# Patient Record
Sex: Female | Born: 1965 | Race: Black or African American | Hispanic: No | Marital: Single | Smoking: Never smoker
Health system: Southern US, Community
[De-identification: ages and names within clinical notes are randomized; demographics above are authoritative.]

## PROBLEM LIST (undated history)

## (undated) DIAGNOSIS — I319 Disease of pericardium, unspecified: Secondary | ICD-10-CM

## (undated) DIAGNOSIS — Z8489 Family history of other specified conditions: Secondary | ICD-10-CM

## (undated) DIAGNOSIS — M329 Systemic lupus erythematosus, unspecified: Secondary | ICD-10-CM

## (undated) DIAGNOSIS — IMO0002 Reserved for concepts with insufficient information to code with codable children: Secondary | ICD-10-CM

## (undated) DIAGNOSIS — I10 Essential (primary) hypertension: Secondary | ICD-10-CM

## (undated) HISTORY — PX: FOOT SURGERY: SHX648

## (undated) HISTORY — PX: ESSURE TUBAL LIGATION: SUR464

---

## 1999-03-09 ENCOUNTER — Emergency Department (HOSPITAL_COMMUNITY): Admission: EM | Admit: 1999-03-09 | Discharge: 1999-03-09 | Payer: Self-pay

## 2001-07-05 ENCOUNTER — Emergency Department (HOSPITAL_COMMUNITY): Admission: EM | Admit: 2001-07-05 | Discharge: 2001-07-05 | Payer: Self-pay | Admitting: Internal Medicine

## 2001-11-01 ENCOUNTER — Other Ambulatory Visit: Admission: RE | Admit: 2001-11-01 | Discharge: 2001-11-01 | Payer: Self-pay | Admitting: Obstetrics and Gynecology

## 2003-01-18 ENCOUNTER — Other Ambulatory Visit: Admission: RE | Admit: 2003-01-18 | Discharge: 2003-01-18 | Payer: Self-pay | Admitting: Obstetrics & Gynecology

## 2004-06-25 ENCOUNTER — Ambulatory Visit (HOSPITAL_BASED_OUTPATIENT_CLINIC_OR_DEPARTMENT_OTHER): Admission: RE | Admit: 2004-06-25 | Discharge: 2004-06-25 | Payer: Self-pay | Admitting: Obstetrics and Gynecology

## 2004-06-25 ENCOUNTER — Ambulatory Visit (HOSPITAL_COMMUNITY): Admission: RE | Admit: 2004-06-25 | Discharge: 2004-06-25 | Payer: Self-pay | Admitting: Obstetrics and Gynecology

## 2004-06-25 ENCOUNTER — Encounter (INDEPENDENT_AMBULATORY_CARE_PROVIDER_SITE_OTHER): Payer: Self-pay | Admitting: *Deleted

## 2005-06-06 ENCOUNTER — Emergency Department (HOSPITAL_COMMUNITY): Admission: EM | Admit: 2005-06-06 | Discharge: 2005-06-06 | Payer: Self-pay | Admitting: Emergency Medicine

## 2007-08-26 ENCOUNTER — Emergency Department (HOSPITAL_COMMUNITY): Admission: EM | Admit: 2007-08-26 | Discharge: 2007-08-26 | Payer: Self-pay | Admitting: Emergency Medicine

## 2009-09-11 ENCOUNTER — Emergency Department (HOSPITAL_COMMUNITY): Admission: EM | Admit: 2009-09-11 | Discharge: 2009-09-12 | Payer: Self-pay | Admitting: Emergency Medicine

## 2010-12-04 ENCOUNTER — Other Ambulatory Visit: Payer: Self-pay | Admitting: Obstetrics and Gynecology

## 2010-12-04 DIAGNOSIS — Z1239 Encounter for other screening for malignant neoplasm of breast: Secondary | ICD-10-CM

## 2010-12-23 ENCOUNTER — Ambulatory Visit
Admission: RE | Admit: 2010-12-23 | Discharge: 2010-12-23 | Disposition: A | Discharge status: Home / Self Care | Payer: 59 | Source: Ambulatory Visit | Attending: Obstetrics and Gynecology | Admitting: Obstetrics and Gynecology

## 2010-12-23 DIAGNOSIS — Z1239 Encounter for other screening for malignant neoplasm of breast: Secondary | ICD-10-CM

## 2011-01-22 ENCOUNTER — Other Ambulatory Visit: Payer: Self-pay | Admitting: Obstetrics

## 2011-02-14 ENCOUNTER — Emergency Department (HOSPITAL_COMMUNITY)
Admission: EM | Admit: 2011-02-14 | Discharge: 2011-02-14 | Disposition: A | Discharge status: Home / Self Care | Payer: 59 | Attending: Emergency Medicine | Admitting: Emergency Medicine

## 2011-02-14 DIAGNOSIS — I10 Essential (primary) hypertension: Secondary | ICD-10-CM | POA: Insufficient documentation

## 2011-02-14 DIAGNOSIS — M2550 Pain in unspecified joint: Secondary | ICD-10-CM | POA: Insufficient documentation

## 2011-02-14 LAB — SEDIMENTATION RATE: Sed Rate: 22 mm/hr (ref 0–22)

## 2011-02-14 LAB — DIFFERENTIAL
Basophils Absolute: 0 10*3/uL (ref 0.0–0.1)
Basophils Relative: 1 % (ref 0–1)
Eosinophils Absolute: 0.2 10*3/uL (ref 0.0–0.7)
Eosinophils Relative: 5 % (ref 0–5)
Lymphocytes Relative: 20 % (ref 12–46)
Lymphs Abs: 0.8 10*3/uL (ref 0.7–4.0)
Monocytes Relative: 6 % (ref 3–12)
Neutro Abs: 2.6 10*3/uL (ref 1.7–7.7)
Neutrophils Relative %: 68 % (ref 43–77)

## 2011-02-14 LAB — POCT I-STAT, CHEM 8
BUN: 9 mg/dL (ref 6–23)
Chloride: 105 mEq/L (ref 96–112)
Sodium: 142 mEq/L (ref 135–145)
TCO2: 26 mmol/L (ref 0–100)

## 2011-02-14 LAB — CBC
HCT: 32.6 % — ABNORMAL LOW (ref 36.0–46.0)
Hemoglobin: 11.5 g/dL — ABNORMAL LOW (ref 12.0–15.0)
MCH: 27.7 pg (ref 26.0–34.0)
MCHC: 35.3 g/dL (ref 30.0–36.0)

## 2011-03-27 NOTE — Op Note (Signed)
NAME:  Sandra Stevenson, Sandra Stevenson                      ACCOUNT NO.:  [id number]   MEDICAL RECORD NO.:  [id number]                   PATIENT TYPE:  AMB   LOCATION:  [facility]                                 FACILITY:  [facility]   PHYSICIAN:  Sherry A. Rosalio Macadamia, M.D.           DATE OF BIRTH:  04/27/1966   DATE OF PROCEDURE:  06/25/2004  DATE OF DISCHARGE:                                 OPERATIVE REPORT   PREOPERATIVE DIAGNOSIS:  Intrauterine pregnancy, desire for termination,  second trimester.   POSTOPERATIVE DIAGNOSIS:  Intrauterine pregnancy, desire for termination,  second trimester.   PROCEDURE:  D&E.   SURGEON:  Dr. Rosalio Macadamia   ANESTHESIA:  MAC.   INDICATIONS FOR PROCEDURE:  This is a 45 year old, G4, P1-0-2-1 woman, who  had a positive pregnancy test, initially felt she was trying to maintain the  pregnancy but requested a termination of this pregnancy.  The patient had an  ultrasound performed at 11-1/2 weeks on August 4, showing an [facility] of December 27, 2004, making this termination a second trimester termination.   DESCRIPTION OF PROCEDURE:  The patient was brought into the operating room  and given adequate IV sedation.  She was placed in the dorsal lithotomy  position.  Pelvic examination was performed.  The patient was draped in a  sterile fashion, speculum placed within the vagina.  The vagina was washed  with Hibiclens.  Paracervical block was administered with 1% Nesacaine.  Anterior lip of the cervix was grasped with a single-tooth tenaculum after  removing the laminaria that had been placed the day before.  The cervix was  well-dilated from the laminaria.  Dilatation was begun at approximately #27  [facility] dilator.  Dilatation was increased to a #37.  A 12 mm curved curette  was introduced into the endometrial cavity.  Suction was applied.  Placental  tissue, fetal tissue, and amniotic fluid were obtained.  Using grasping  forceps, other fetal tissue was removed.  Sharp curettage  was performed.  There was no further tissue present.  Repeat suctioning was performed.  There was still a small amount of continuous bleeding.  Sharp curettage and  suction was repeated and all instruments removed from the vagina.  Uterine  massage was performed.  After a short period of time of uterine massage, the  speculum was re-placed and suction reapplied. Adequate hemostasis was felt  to be present after careful monitoring.  All instruments were again removed  from the vagina.  The patient was taken out of the dorsal lithotomy  position.  She was awakened.  She was moved from the operating room table to  a stretcher in stable condition.  Complications were none.  Estimated blood  loss was 30 mL.  Sherry A. Rosalio Macadamia, M.D.    SAD/MEDQ  D:  06/25/2004  T:  06/25/2004  Job:  [id number]

## 2011-08-19 LAB — DIFFERENTIAL
Basophils Relative: 0
Eosinophils Absolute: 0.2
Eosinophils Relative: 3
Lymphs Abs: 1.1

## 2011-08-19 LAB — POCT CARDIAC MARKERS
CKMB, poc: <1 — ABNORMAL LOW
Operator id: 4001
Operator id: 4295
Troponin i, poc: <0.05

## 2011-08-19 LAB — CBC
HCT: 34.4 — ABNORMAL LOW
Hemoglobin: 11.9 — ABNORMAL LOW
RBC: 4.12
RDW: 13.5
WBC: 4.5

## 2011-08-19 LAB — D-DIMER, QUANTITATIVE: D-Dimer, Quant: 0.25

## 2011-08-19 LAB — BASIC METABOLIC PANEL
Chloride: 96
Creatinine, Ser: 0.7
GFR calc Af Amer: >60
GFR calc non Af Amer: >60
Potassium: 2.9 — ABNORMAL LOW

## 2011-11-19 ENCOUNTER — Other Ambulatory Visit: Payer: Self-pay | Admitting: Obstetrics

## 2011-11-19 DIAGNOSIS — Z1231 Encounter for screening mammogram for malignant neoplasm of breast: Secondary | ICD-10-CM

## 2011-12-25 ENCOUNTER — Ambulatory Visit: Payer: 59

## 2012-01-18 ENCOUNTER — Ambulatory Visit
Admission: RE | Admit: 2012-01-18 | Discharge: 2012-01-18 | Disposition: A | Discharge status: Home / Self Care | Payer: 59 | Source: Ambulatory Visit | Attending: Obstetrics | Admitting: Obstetrics

## 2012-01-18 DIAGNOSIS — Z1231 Encounter for screening mammogram for malignant neoplasm of breast: Secondary | ICD-10-CM

## 2012-04-15 ENCOUNTER — Ambulatory Visit: Payer: 59

## 2012-04-15 ENCOUNTER — Emergency Department (HOSPITAL_COMMUNITY)
Admission: EM | Admit: 2012-04-15 | Discharge: 2012-04-16 | Disposition: A | Discharge status: Home / Self Care | Payer: 59 | Attending: Emergency Medicine | Admitting: Emergency Medicine

## 2012-04-15 ENCOUNTER — Ambulatory Visit (INDEPENDENT_AMBULATORY_CARE_PROVIDER_SITE_OTHER): Payer: 59 | Admitting: Family Medicine

## 2012-04-15 ENCOUNTER — Encounter (HOSPITAL_COMMUNITY): Payer: Self-pay | Admitting: *Deleted

## 2012-04-15 VITALS — BP 154/97 | HR 109 | Temp 98.5°F | Resp 16 | Ht 64.5 in | Wt 153.0 lb

## 2012-04-15 DIAGNOSIS — R071 Chest pain on breathing: Secondary | ICD-10-CM | POA: Insufficient documentation

## 2012-04-15 DIAGNOSIS — R0789 Other chest pain: Secondary | ICD-10-CM

## 2012-04-15 DIAGNOSIS — M546 Pain in thoracic spine: Secondary | ICD-10-CM

## 2012-04-15 DIAGNOSIS — R0602 Shortness of breath: Secondary | ICD-10-CM | POA: Insufficient documentation

## 2012-04-15 DIAGNOSIS — I517 Cardiomegaly: Secondary | ICD-10-CM

## 2012-04-15 DIAGNOSIS — J40 Bronchitis, not specified as acute or chronic: Secondary | ICD-10-CM | POA: Insufficient documentation

## 2012-04-15 DIAGNOSIS — R000 Tachycardia, unspecified: Secondary | ICD-10-CM

## 2012-04-15 DIAGNOSIS — R091 Pleurisy: Secondary | ICD-10-CM

## 2012-04-15 DIAGNOSIS — R079 Chest pain, unspecified: Secondary | ICD-10-CM

## 2012-04-15 DIAGNOSIS — M329 Systemic lupus erythematosus, unspecified: Secondary | ICD-10-CM | POA: Insufficient documentation

## 2012-04-15 DIAGNOSIS — M549 Dorsalgia, unspecified: Secondary | ICD-10-CM

## 2012-04-15 HISTORY — DX: Systemic lupus erythematosus, unspecified: M32.9

## 2012-04-15 HISTORY — DX: Essential (primary) hypertension: I10

## 2012-04-15 HISTORY — DX: Reserved for concepts with insufficient information to code with codable children: IMO0002

## 2012-04-15 LAB — POCT CBC
Granulocyte percent: 69.3 %G (ref 37–80)
MCH, POC: 26.4 pg — AB (ref 27–31.2)
MID (cbc): 0.5 (ref 0–0.9)
POC LYMPH PERCENT: 24.6 %L (ref 10–50)
POC MID %: 6.1 %M (ref 0–12)
WBC: 8.2 10*3/uL (ref 4.6–10.2)

## 2012-04-15 LAB — BASIC METABOLIC PANEL
BUN: 14 mg/dL (ref 6–23)
Chloride: 102 mEq/L (ref 96–112)
Creatinine, Ser: 0.85 mg/dL (ref 0.50–1.10)
GFR calc Af Amer: >90 mL/min (ref >90)
Glucose, Bld: 94 mg/dL (ref 70–99)

## 2012-04-15 LAB — CBC
Hemoglobin: 11.1 g/dL — ABNORMAL LOW (ref 12.0–15.0)
MCHC: 36 g/dL (ref 30.0–36.0)
MCV: 76.4 fL — ABNORMAL LOW (ref 78.0–100.0)
Platelets: 178 10*3/uL (ref 150–400)
RBC: 4.03 MIL/uL (ref 3.87–5.11)
RDW: 13.7 % (ref 11.5–15.5)

## 2012-04-15 LAB — DIFFERENTIAL
Eosinophils Absolute: 0.1 10*3/uL (ref 0.0–0.7)
Eosinophils Relative: 1 % (ref 0–5)
Lymphocytes Relative: 18 % (ref 12–46)
Monocytes Absolute: 0.6 10*3/uL (ref 0.1–1.0)
Neutro Abs: 5.2 10*3/uL (ref 1.7–7.7)

## 2012-04-15 LAB — POCT I-STAT, CHEM 8
Calcium, Ion: 1.15 mmol/L (ref 1.12–1.32)
Chloride: 97 mEq/L (ref 96–112)
Glucose, Bld: 93 mg/dL (ref 70–99)
HCT: 40 % (ref 36.0–46.0)
Hemoglobin: 13.6 g/dL (ref 12.0–15.0)

## 2012-04-15 MED ORDER — AZITHROMYCIN 250 MG PO TABS
500.0000 mg | ORAL_TABLET | Freq: Once | ORAL | Status: AC
  Administered 2012-04-16: 500 mg via ORAL
  Filled 2012-04-15: qty 2

## 2012-04-15 MED ORDER — TRAMADOL HCL 50 MG PO TABS: 50.0000 mg | ORAL_TABLET | Freq: Four times a day (QID) | ORAL | Status: AC | PRN

## 2012-04-15 MED ORDER — KETOROLAC TROMETHAMINE 30 MG/ML IJ SOLN
30.0000 mg | Freq: Once | INTRAMUSCULAR | Status: AC
  Administered 2012-04-15: 30 mg via INTRAVENOUS
  Filled 2012-04-15: qty 1

## 2012-04-15 MED ORDER — AZITHROMYCIN 250 MG PO TABS: 250.0000 mg | ORAL_TABLET | Freq: Every day | ORAL | Status: AC

## 2012-04-15 MED ORDER — SODIUM CHLORIDE 0.9 % IV BOLUS (SEPSIS)
1000.0000 mL | Freq: Once | INTRAVENOUS | Status: AC
  Administered 2012-04-15: 1000 mL via INTRAVENOUS

## 2012-04-15 MED ORDER — MORPHINE SULFATE 4 MG/ML IJ SOLN
4.0000 mg | Freq: Once | INTRAMUSCULAR | Status: AC
  Administered 2012-04-15: 4 mg via INTRAVENOUS
  Filled 2012-04-15: qty 1

## 2012-04-15 NOTE — Progress Notes (Signed)
Subjective: 46 year old lady with history of lupus. Patient been having upper back pain this week for the last few days. Since then swollen the upper back just below the shoulder level, warm and tender. She's used warm compresses on it. He has continued to hurt. Has been a constant soreness. Today however it seemed to move around in her chest. When she got up she was very painful across the upper chest. She hit hurts to take a deep breath. Does not have any cough. Has not any fever. Does not have any URI symptoms. She has not had pain like this before. It is a steady  hurting.  Objective  patient is in some obvious discomfort which takes a deep breath. She catches herself across her upper chest. Her throat is clear. Neck supple without significant nodes. Chest is clear to auscultation. Heart regular without any murmurs. Seems a little chest wall tenderness in the right upper chest. Her abdomen is soft without masses tenderness. However when I laid her down to examine her abdomen she got very more intense pain in the upper chest.  EKG shows a mild tachycardia heart rate 110. Sinus rhythm. Mild nonspecific ST flattening and lateral inversion which appears fairly nonspecific. Tachycardias only significant abnormality.  [facility] reading (PRIMARY) by  Dr. Alwyn Ren Cardiomegaly.  Possible posterior infiltrate behind heart  Results for orders placed in visit on 04/15/12  POCT CBC      Component Value Range   WBC 8.2  4.6 - 10.2 (K/uL)   Lymph, poc 2.0  0.6 - 3.4    POC LYMPH PERCENT 24.6  10 - 50 (%L)   MID (cbc) 0.5  0 - 0.9    POC MID % 6.1  0 - 12 (%M)   POC Granulocyte 5.7  2 - 6.9    Granulocyte percent 69.3  37 - 80 (%G)   RBC 4.24  4.04 - 5.48 (M/uL)   Hemoglobin 11.2 (*) 12.2 - 16.2 (g/dL)   HCT, POC 78.2 (*) 95.6 - 47.9 (%)   MCV 80.7  80 - 97 (fL)   MCH, POC 26.4 (*) 27 - 31.2 (pg)   MCHC 32.7  31.8 - 35.4 (g/dL)   RDW, POC 21.3     Platelet Count, POC 213  142 - 424 (K/uL)   MPV 9.7  0 -  99.8 (fL)   Assessment Pleuritic anterior chest pain Cardiomegaly Mild congestive change on chest x-ray Lupus Anemia  Plan: Her pain is significant enough along with the cardiac changes on x-ray and the blunting at the bases of the lung that I feel like she needs to be seen in to the emergency room for assessment to try and decide what is going on with her. This is an atypical chest pain, but differential would include lupus related paracardial problems, pulmonary emboli, other cardiomyopathy, viral pleurisy, etc.    Begin IV and call EMS and given oxygen

## 2012-04-15 NOTE — Progress Notes (Signed)
Pt with chest tightness and back pain.  She has a history of lupus.  She had a similar episode in February 2013 diagnosed as pleurisy and treated with a Z-pak successfully.  Chest x-ray shows atelectasis at bases, otherwise tests negative.  Will Rx with a Z-pak, and tramadol for pain.

## 2012-04-15 NOTE — Patient Instructions (Signed)
We are happy to be of assistance at a time that we can.

## 2012-04-15 NOTE — ED Provider Notes (Signed)
History     CSN: [id number]  Arrival date & time 04/15/12  2008   First MD Initiated Contact with Patient 04/15/12 2016      Chief Complaint  Patient presents with  . Chest Pain    (Consider location/radiation/quality/duration/timing/severity/associated sxs/prior treatment) HPI  46 year old female with history of hypertension and history of lupus presents complaining of chest pain. Patient states for the past week she has had pain to the upper back spanning across her shoulders. Described pain as a burning sensation, achy, and persistent. Today, she noticed pleuritic chest pain worsening with taking deep breath, or when laying flat. Pain improves with leaning forward. There is associated shortness of breath due to increased pain with deep breathing. Onset is gradual, and persistent. She denies fever, chills, hemoptysis, productive cough, nausea, vomiting, diarrhea, abdominal pain, or rash.  She denies exogenous hormone, recent surgery, travel, prolonged bed rest, leg swelling or calf tenderness. Patient states since she is allergic to Plaquenil, she has been taking 10 mg of prednisone daily for the past month. She is a nonsmoker. She does have family history of heart disease.   History reviewed. No pertinent past medical history.  History reviewed. No pertinent past surgical history.  No family history on file.  History  Substance Use Topics  . Smoking status: Never Smoker   . Smokeless tobacco: Not on file  . Alcohol Use: Not on file    OB History    Grav Para Term Preterm Abortions TAB SAB Ect Mult Living                  Review of Systems  All other systems reviewed and are negative.    Allergies  Plaquenil  Home Medications   Current Outpatient Rx  Name Route Sig Dispense Refill  . PREDNISONE 10 MG PO TABS Oral Take 10 mg by mouth daily.      BP 170/108  Pulse 119  Temp(Src) 99.8 F (37.7 C) (Oral)  Resp 24  SpO2 99%  Physical Exam  Nursing note and  vitals reviewed. Constitutional: She is oriented to person, place, and time. She appears well-developed and well-nourished. No distress.       Awake, alert, nontoxic appearance  HENT:  Head: Atraumatic.  Eyes: Conjunctivae are normal. Right eye exhibits no discharge. Left eye exhibits no discharge.  Neck: Neck supple.  Cardiovascular: Normal rate and regular rhythm.   Pulmonary/Chest: Effort normal. No respiratory distress. She exhibits no tenderness.  Abdominal: Soft. There is no tenderness. There is no rebound.  Musculoskeletal: She exhibits no edema and no tenderness.       ROM appears intact, no obvious focal weakness.  Pt point to upper back as source of pain, however nonreproducible on exam.  Pt also has midchest pain, non reproducible.  NO overlying skin changes  Neurological: She is alert and oriented to person, place, and time.       Mental status and motor strength appears intact  Skin: No rash noted.  Psychiatric: She has a normal mood and affect.    ED Course  Procedures (including critical care time)  Labs Reviewed - No data to display Dg Chest 2 View  04/15/2012  *RADIOLOGY REPORT*  Clinical Data: Chest pain with breathing  CHEST - 2 VIEW  Comparison: Chest radiograph 08/26/1999  Findings: Enlarged cardiopericardial silhouette, similar to prior exam.  Pulmonary vascularity is within normal limits.  The lung volumes are slightly low.  There are faint bibasilar opacities.  No definite  pleural effusion.  Negative for pneumothorax.  Trachea midline.  No acute bony abnormality.  IMPRESSION: Low lung volumes with patchy bibasilar opacities.  These could reflect atelectasis and/or infiltrate.  Cardiomegaly without edema.  Clinically significant discrepancy from primary report, if provided: None  Original Report Authenticated By: Britta Mccreedy, M.D.     No diagnosis found.   Date: 04/15/2012  Rate: 111  Rhythm: sinus tachycardia  QRS Axis: normal  Intervals: normal  ST/T Wave  abnormalities: nonspecific T wave changes  Conduction Disutrbances:slight mid- and left-precordial repolarization disturbance, consider ischemia, LV overload or aspecific change  Narrative Interpretation:   Old EKG Reviewed: none available  Results for orders placed during the hospital encounter of 04/15/12  CBC      Component Value Range   WBC 7.2  4.0 - 10.5 (K/uL)   RBC 4.03  3.87 - 5.11 (MIL/uL)   Hemoglobin 11.1 (*) 12.0 - 15.0 (g/dL)   HCT 47.8 (*) 29.5 - 46.0 (%)   MCV 76.4 (*) 78.0 - 100.0 (fL)   MCH 27.5  26.0 - 34.0 (pg)   MCHC 36.0  30.0 - 36.0 (g/dL)   RDW 62.1  30.8 - 65.7 (%)   Platelets 178  150 - 400 (K/uL)  DIFFERENTIAL      Component Value Range   Neutrophils Relative PENDING  43 - 77 (%)   Neutro Abs PENDING  1.7 - 7.7 (K/uL)   Band Neutrophils PENDING  0 - 10 (%)   Lymphocytes Relative PENDING  12 - 46 (%)   Lymphs Abs PENDING  0.7 - 4.0 (K/uL)   Monocytes Relative PENDING  3 - 12 (%)   Monocytes Absolute PENDING  0.1 - 1.0 (K/uL)   Eosinophils Relative PENDING  0 - 5 (%)   Eosinophils Absolute PENDING  0.0 - 0.7 (K/uL)   Basophils Relative PENDING  0 - 1 (%)   Basophils Absolute PENDING  0.0 - 0.1 (K/uL)   WBC Morphology PENDING     RBC Morphology PENDING     Smear Review PENDING     nRBC PENDING  0 (/100 WBC)   Metamyelocytes Relative PENDING     Myelocytes PENDING     Promyelocytes Absolute PENDING     Blasts PENDING    TROPONIN I      Component Value Range   Troponin I <0.30  <0.30 (ng/mL)  POCT I-STAT, CHEM 8      Component Value Range   Sodium 165 (*) 135 - 145 (mEq/L)   Potassium 3.6  3.5 - 5.1 (mEq/L)   Chloride 97  96 - 112 (mEq/L)   BUN 16  6 - 23 (mg/dL)   Creatinine, Ser 8.46  0.50 - 1.10 (mg/dL)   Glucose, Bld 93  70 - 99 (mg/dL)   Calcium, Ion 9.62  9.52 - 1.32 (mmol/L)   TCO2 27  0 - 100 (mmol/L)   Hemoglobin 13.6  12.0 - 15.0 (g/dL)   HCT 84.1  32.4 - 40.1 (%)   Comment NOTIFIED PHYSICIAN    BASIC METABOLIC PANEL       Component Value Range   Sodium 138  135 - 145 (mEq/L)   Potassium 3.3 (*) 3.5 - 5.1 (mEq/L)   Chloride 102  96 - 112 (mEq/L)   CO2 24  19 - 32 (mEq/L)   Glucose, Bld 94  70 - 99 (mg/dL)   BUN 14  6 - 23 (mg/dL)   Creatinine, Ser 0.27  0.50 - 1.10 (mg/dL)  Calcium 8.4  8.4 - 10.5 (mg/dL)   GFR calc non Af Amer 83 (*) >90 (mL/min)   GFR calc Af Amer >90  >90 (mL/min)   Dg Chest 2 View  04/15/2012  *RADIOLOGY REPORT*  Clinical Data: Chest pain with breathing  CHEST - 2 VIEW  Comparison: Chest radiograph 08/26/1999  Findings: Enlarged cardiopericardial silhouette, similar to prior exam.  Pulmonary vascularity is within normal limits.  The lung volumes are slightly low.  There are faint bibasilar opacities.  No definite pleural effusion.  Negative for pneumothorax.  Trachea midline.  No acute bony abnormality.  IMPRESSION: Low lung volumes with patchy bibasilar opacities.  These could reflect atelectasis and/or infiltrate.  Cardiomegaly without edema.  Clinically significant discrepancy from primary report, if provided: None  Original Report Authenticated By: Britta Mccreedy, M.D.      MDM  Pt w/ hx of lupus present with pleuritic cp suggestive of pericarditis. CXR show cardiomegaly without edema.  Thre are patchy bibasilar opacities which could reflect atelectasis and/or infiltrate.  CXR were reviewed by me.  Pt denies cough or fever.  ECG unremarkable.  Will obtain CBC, Istat, Troponin I.  Will give toradol.  Pt currently on prednisone.     10:10 PM Toradol given in ED provide some relief.  Pt has more relief when leaning forward.  Na+ is 165, will repeat BMP to verify.    11:06 PM Repeat Na+ is within normal range.  Her electrolytes are otherwise unremarkable.  Negative troponin.  Pt felt better after treatment with toradol and morphine.    11:32 PM Since pt has a similar episode earlier this year that seems to improves with zpak, will prescribe zpak and tramadol as treatment for bronchitis,  per my attending's recommendation.  Pt voice understanding and agrees with plan.  Pt will f/u with PCP for further care.    Fayrene Helper, PA-C 04/15/12 2334

## 2012-04-15 NOTE — ED Notes (Signed)
Pt is being sent from Urgent Care facility. Sharp chest pain that started today. CP is positional, nontender to palpation. Pt has hx of lupus-concerns for pericarditis. . No acute changes on EKG.

## 2012-04-15 NOTE — Discharge Instructions (Signed)
Please take Zpak and tramadol for treatment of bronchitis.  You may also take OTC ibuprofen for pain.  If you experience fever, worsening pain, shortness of breath then return for further evaluation.  Follow up with your doctor at [facility] for further management of your lupus  Bronchitis Bronchitis is the body's way of reacting to injury and/or infection (inflammation) of the bronchi. Bronchi are the air tubes that extend from the windpipe into the lungs. If the inflammation becomes severe, it may cause shortness of breath. CAUSES  Inflammation may be caused by:  A virus.   Germs (bacteria).   Dust.   Allergens.   Pollutants and many other irritants.  The cells lining the bronchial tree are covered with tiny hairs (cilia). These constantly beat upward, away from the lungs, toward the mouth. This keeps the lungs free of pollutants. When these cells become too irritated and are unable to do their job, mucus begins to develop. This causes the characteristic cough of bronchitis. The cough clears the lungs when the cilia are unable to do their job. Without either of these protective mechanisms, the mucus would settle in the lungs. Then you would develop pneumonia. Smoking is a common cause of bronchitis and can contribute to pneumonia. Stopping this habit is the single most important thing you can do to help yourself. TREATMENT   Your caregiver may prescribe an antibiotic if the cough is caused by bacteria. Also, medicines that open up your airways make it easier to breathe. Your caregiver may also recommend or prescribe an expectorant. It will loosen the mucus to be coughed up. Only take over-the-counter or prescription medicines for pain, discomfort, or fever as directed by your caregiver.   Removing whatever causes the problem (smoking, for example) is critical to preventing the problem from getting worse.   Cough suppressants may be prescribed for relief of cough symptoms.   Inhaled medicines may  be prescribed to help with symptoms now and to help prevent problems from returning.   For those with recurrent (chronic) bronchitis, there may be a need for steroid medicines.  SEEK IMMEDIATE MEDICAL CARE IF:   During treatment, you develop more pus-like mucus (purulent sputum).   You have a fever.   Your baby is older than 3 months with a rectal temperature of 102 F (38.9 C) or higher.   Your baby is 49 months old or younger with a rectal temperature of 100.4 F (38 C) or higher.   You become progressively more ill.   You have increased difficulty breathing, wheezing, or shortness of breath.  It is necessary to seek immediate medical care if you are elderly or sick from any other disease. MAKE SURE YOU:   Understand these instructions.   Will watch your condition.   Will get help right away if you are not doing well or get worse.  Document Released: 10/26/2005 Document Revised: 10/15/2011 Document Reviewed: 09/04/2008 [facility] Patient Information 2012 [place], [place].

## 2012-04-15 NOTE — ED Provider Notes (Signed)
8:32 PM  Date: 04/15/2012  Rate: 119  Rhythm: sinus tachycardia  QRS Axis: normal  Intervals: normal PQRS:  Left atrial abnormality  ST/T Wave abnormalities: nonspecific T wave changes  Conduction Disutrbances:none  Narrative Interpretation: Borderline EKG  Old EKG Reviewed: unchanged    Carleene Cooper III, MD 04/15/12 2033

## 2012-04-16 ENCOUNTER — Telehealth: Payer: Self-pay

## 2012-04-16 LAB — POCT SEDIMENTATION RATE: POCT SED RATE: 43 mm/hr — AB (ref 0–22)

## 2012-04-16 NOTE — ED Provider Notes (Signed)
Medical screening examination/treatment/procedure(s) were conducted as a shared visit with non-physician practitioner(s) and myself.  I personally evaluated the patient during the encounter 46 yo woman with lupus has chest pain, originating in upper back, and having a pleuritic character.  Prior similar episode in February 2013 was diagnosed as pleurisy and was treated with a Z-pak with resolution of Sx.  Chest x-ray today shows bibasilar atelectasis.  Advised Rx with Z-pak.  Carleene Cooper III, MD 04/16/12 [phone]

## 2012-04-16 NOTE — ED Notes (Signed)
Pt up for discharge. Requesting something to eat before taking ordered med. Also pt gives hx of increased BP and states she does not take meds for BP as prescribed.

## 2012-04-16 NOTE — ED Notes (Signed)
Pt given happy meal and drink 

## 2012-11-09 DIAGNOSIS — I319 Disease of pericardium, unspecified: Secondary | ICD-10-CM

## 2012-11-09 HISTORY — DX: Disease of pericardium, unspecified: I31.9

## 2012-12-16 ENCOUNTER — Other Ambulatory Visit: Payer: Self-pay | Admitting: Obstetrics

## 2012-12-16 DIAGNOSIS — Z1231 Encounter for screening mammogram for malignant neoplasm of breast: Secondary | ICD-10-CM

## 2013-01-18 ENCOUNTER — Ambulatory Visit
Admission: RE | Admit: 2013-01-18 | Discharge: 2013-01-18 | Disposition: A | Discharge status: Home / Self Care | Payer: 59 | Source: Ambulatory Visit | Attending: Obstetrics | Admitting: Obstetrics

## 2013-01-18 DIAGNOSIS — Z1231 Encounter for screening mammogram for malignant neoplasm of breast: Secondary | ICD-10-CM

## 2013-12-22 ENCOUNTER — Other Ambulatory Visit: Payer: Self-pay

## 2013-12-22 DIAGNOSIS — Z1231 Encounter for screening mammogram for malignant neoplasm of breast: Secondary | ICD-10-CM

## 2013-12-27 ENCOUNTER — Ambulatory Visit: Payer: 59 | Admitting: Gynecology

## 2013-12-27 ENCOUNTER — Encounter: Payer: Self-pay | Admitting: Gynecology

## 2013-12-27 ENCOUNTER — Ambulatory Visit (INDEPENDENT_AMBULATORY_CARE_PROVIDER_SITE_OTHER): Payer: 59 | Admitting: Gynecology

## 2013-12-27 ENCOUNTER — Other Ambulatory Visit (HOSPITAL_COMMUNITY)
Admission: RE | Admit: 2013-12-27 | Discharge: 2013-12-27 | Disposition: A | Discharge status: Home / Self Care | Payer: 59 | Source: Ambulatory Visit | Attending: Gynecology | Admitting: Gynecology

## 2013-12-27 VITALS — BP 134/96 | Ht 64.0 in | Wt 155.0 lb

## 2013-12-27 DIAGNOSIS — Z01419 Encounter for gynecological examination (general) (routine) without abnormal findings: Secondary | ICD-10-CM | POA: Insufficient documentation

## 2013-12-27 DIAGNOSIS — N926 Irregular menstruation, unspecified: Secondary | ICD-10-CM

## 2013-12-27 DIAGNOSIS — Z1151 Encounter for screening for human papillomavirus (HPV): Secondary | ICD-10-CM | POA: Insufficient documentation

## 2013-12-27 DIAGNOSIS — Z113 Encounter for screening for infections with a predominantly sexual mode of transmission: Secondary | ICD-10-CM

## 2013-12-27 LAB — HIV ANTIBODY (ROUTINE TESTING W REFLEX): HIV: NONREACTIVE

## 2013-12-27 NOTE — Progress Notes (Addendum)
LOVE CHOWNING Nov 14, 1965 [id number]   History:    48 y.o.  for annual gyn exam who is a new patient to the practice. Patient has been followed by rheumatologist  as a result of her lupus. Her PCP is Dr. Criss Rosales both of which been drawing  her blood work. Her main complaint today is that she has been having irregular cycles for several years whereby since she had her Essure placed 2 years ago she has been complaining of 2 cycles per month averaging approximately 5 days in duration. Patient denied any nipple discharge, headaches, or visual disturbances. Patient denied any prior history of abnormal Pap smears or any STDs. Patient requesting HIV screen today.  Past medical history,surgical history, family history and social history were all reviewed and documented in the EPIC chart.  Gynecologic History Patient's last menstrual period was 12/23/2013. Contraception: Essure Last Pap: Several years ago. Results were: Reported to be normal Last mammogram: 2014. Results were: Dense but normal  Obstetric History OB History  Gravida Para Term Preterm AB SAB TAB Ectopic Multiple Living  3 1   1 1    1     # Outcome Date GA Lbr Len/2nd Weight Sex Delivery Anes PTL Lv  3 SAB           2 PAR           1 GRA                ROS: A ROS was performed and pertinent positives and negatives are included in the history.  GENERAL: No fevers or chills. HEENT: No change in vision, no earache, sore throat or sinus congestion. NECK: No pain or stiffness. CARDIOVASCULAR: No chest pain or pressure. No palpitations. PULMONARY: No shortness of breath, cough or wheeze. GASTROINTESTINAL: No abdominal pain, nausea, vomiting or diarrhea, melena or bright red blood per rectum. GENITOURINARY: No urinary frequency, urgency, hesitancy or dysuria. MUSCULOSKELETAL: No joint or muscle pain, no back pain, no recent trauma. DERMATOLOGIC: No rash, no itching, no lesions. ENDOCRINE: No polyuria, polydipsia, no heat or cold  intolerance. No recent change in weight. HEMATOLOGICAL: No anemia or easy bruising or bleeding. NEUROLOGIC: No headache, seizures, numbness, tingling or weakness. PSYCHIATRIC: No depression, no loss of interest in normal activity or change in sleep pattern.     Exam: chaperone present  BP 134/96  Ht 5\' 4"  (1.626 m)  Wt 155 lb (70.308 kg)  BMI 26.59 kg/m2  LMP 12/23/2013  Body mass index is 26.59 kg/(m^2).  General appearance : Well developed well nourished female. No acute distress HEENT: Neck supple, trachea midline, no carotid bruits, no thyroidmegaly Lungs: Clear to auscultation, no rhonchi or wheezes, or rib retractions  Heart: Regular rate and rhythm, no murmurs or gallops Breast:Examined in sitting and supine position were symmetrical in appearance, no palpable masses or tenderness,  no skin retraction, no nipple inversion, no nipple discharge, no skin discoloration, no axillary or supraclavicular lymphadenopathy Abdomen: no palpable masses or tenderness, no rebound or guarding Extremities: no edema or skin discoloration or tenderness  Pelvic:  Bartholin, Urethra, Skene Glands: Within normal limits             Vagina: No gross lesions or discharge  Cervix: No gross lesions or discharge  Uterus  anteverted, normal size, shape and consistency, non-tender and mobile  Adnexa  Without masses or tenderness  Anus and perineum  normal   Rectovaginal  normal sphincter tone without palpated masses or tenderness  Hemoccult not indicated     Assessment/Plan:  48 y.o. female for annual exam with dysfunctional uterine bleeding will be scheduled for sonohysterogram next week. Patient was counseled today for an endometrial biopsy to rule out any evidence of hyperplasia or endometrial carcinoma. The cervix had been cleansed with Betadine solution and a sterile Pipelle was introduced into the uterine cavity which sounded to 7-1/2 cm. Moderate amount of tissue was obtained was  submitted for histological evaluation. Prior to this a Pap smear with HPV screen was obtained. We will also be checking her TSH and prolactin today, as per patient's request and HIV will be ordered as well. I provided her with literature information on her option endometrial ablation. She stated that she did not have much success and had side effects with a Mirena IUD in the past. She did have an Essure placed 2 years ago. We'll discuss further when she comes to the office next week for the sonohysterogram.  Note: This dictation was prepared with  Dragon/digital dictation along withSmart phrase technology. Any transcriptional errors that result from this process are unintentional.   Terrance Mass MD, 4:59 PM 12/27/2013

## 2013-12-27 NOTE — Patient Instructions (Signed)
Endometrial Biopsy Endometrial biopsy is a procedure in which a tissue sample is taken from inside the uterus. The tissue sample is then looked at under a microscope to see if the tissue is normal or abnormal. The endometrium is the lining of the uterus. This procedure helps determine where you are in your menstrual cycle and how hormone levels are affecting the lining of the uterus. This procedure may also be used to evaluate uterine bleeding or to diagnose endometrial cancer, tuberculosis, polyps, or inflammatory conditions.  LET YOUR HEALTH CARE PROVIDER KNOW ABOUT:  Any allergies you have.  All medicines you are taking, including vitamins, herbs, eye drops, creams, and over-the-counter medicines.  Previous problems you or members of your family have had with the use of anesthetics.  Any blood disorders you have.  Previous surgeries you have had.  Medical conditions you have.  Possibility of pregnancy. RISKS AND COMPLICATIONS Generally, this is a safe procedure. However, as with any procedure, complications can occur. Possible complications include:  Bleeding.  Pelvic infection.  Puncture of the uterine wall with the biopsy device (rare). BEFORE THE PROCEDURE   Keep a record of your menstrual cycles as directed by your health care provider. You may need to schedule your procedure for a specific time in your cycle.  You may want to bring a sanitary pad to wear home after the procedure.  Arrange for someone to drive you home after the procedure if you will be given a medicine to help you relax (sedative). PROCEDURE   You may be given a sedative to relax you.  You will lie on an exam table with your feet and legs supported as in a pelvic exam.  Your health care provider will insert an instrument (speculum) into your vagina to see your cervix.  Your cervix will be cleansed with an antiseptic solution. A medicine (local anesthetic) will be used to numb the cervix.  A forceps  instrument (tenaculum) will be used to hold your cervix steady for the biopsy.  A thin, rodlike instrument (uterine sound) will be inserted through your cervix to determine the length of your uterus and the location where the biopsy sample will be removed.  A thin, flexible tube (catheter) will be inserted through your cervix and into the uterus. The catheter is used to collect the biopsy sample from your endometrial tissue.  The catheter and speculum will then be removed, and the tissue sample will be sent to a lab for examination. AFTER THE PROCEDURE  You will rest in a recovery area until you are ready to go home.  You may have mild cramping and a small amount of vaginal bleeding for a few days after the procedure. This is normal.  Make sure you find out how to get your test results. Document Released: 02/26/2005 Document Revised: 06/28/2013 Document Reviewed: 04/12/2013 ExitCare Patient Information 2014 [employer].  

## 2013-12-28 LAB — PROLACTIN: PROLACTIN: 3.7 ng/mL

## 2013-12-28 LAB — TSH: TSH: 0.997 u[IU]/mL (ref 0.350–4.500)

## 2014-01-01 ENCOUNTER — Other Ambulatory Visit: Payer: Self-pay | Admitting: Gynecology

## 2014-01-01 DIAGNOSIS — N926 Irregular menstruation, unspecified: Secondary | ICD-10-CM

## 2014-01-03 ENCOUNTER — Encounter: Payer: Self-pay | Admitting: Gynecology

## 2014-01-03 ENCOUNTER — Other Ambulatory Visit: Payer: Self-pay | Admitting: Gynecology

## 2014-01-03 ENCOUNTER — Ambulatory Visit (INDEPENDENT_AMBULATORY_CARE_PROVIDER_SITE_OTHER): Payer: 59

## 2014-01-03 ENCOUNTER — Ambulatory Visit (INDEPENDENT_AMBULATORY_CARE_PROVIDER_SITE_OTHER): Payer: 59 | Admitting: Gynecology

## 2014-01-03 DIAGNOSIS — D259 Leiomyoma of uterus, unspecified: Secondary | ICD-10-CM

## 2014-01-03 DIAGNOSIS — N8300 Follicular cyst of ovary, unspecified side: Secondary | ICD-10-CM

## 2014-01-03 DIAGNOSIS — N926 Irregular menstruation, unspecified: Secondary | ICD-10-CM

## 2014-01-03 DIAGNOSIS — N920 Excessive and frequent menstruation with regular cycle: Secondary | ICD-10-CM

## 2014-01-03 DIAGNOSIS — N938 Other specified abnormal uterine and vaginal bleeding: Secondary | ICD-10-CM

## 2014-01-03 DIAGNOSIS — N921 Excessive and frequent menstruation with irregular cycle: Secondary | ICD-10-CM

## 2014-01-03 DIAGNOSIS — D251 Intramural leiomyoma of uterus: Secondary | ICD-10-CM

## 2014-01-03 DIAGNOSIS — N949 Unspecified condition associated with female genital organs and menstrual cycle: Secondary | ICD-10-CM

## 2014-01-03 MED ORDER — MEGESTROL ACETATE 40 MG PO TABS: 40.0000 mg | ORAL_TABLET | Freq: Two times a day (BID) | ORAL | Status: DC

## 2014-01-03 NOTE — Progress Notes (Signed)
   Patient's a 48 year old who presented to the office today as part of her workup for her menorrhagia. She was seen as a new patient to the practice on 12/27/2013 see previous note for details. Patient had hysteroscopic sterilization via Essure by another provider in our community 2 years ago. A recent office visit she had an endometrial biopsy which demonstrated the following:  Diagnosis Endometrium, biopsy, uterus - DEGENERATING SECRETORY ENDOMETRIUM. - BENIGN ENDOCERVICAL EPITHELIUM. - NO HYPERPLASIA OR MALIGNANCY.  Her Pap smear demonstrated the following: Diagnosis ATYPICAL SQUAMOUS CELLS OF UNDETERMINED SIGNIFICANCE (ASC-US). High risk HPV not detected  TSH and prolactin were normal  Ultrasound/sonohysterography today: Uterus measured 8.9 x 5.6 x 4.6 cm with endometrial stripe of 6.8  mm. Patient had 2 fibroids subserous the largest measuring 14 x 9 mm. Essure was seen in the right and left cornual tubal region. Right and left ovary was normal. After the cleansing the cervix a Betadine solution sterile catheter was introduced into the uterine cavity and sterile saline was injected. No intracavitary defect was noted.  Assessment/plan: Patient with metromenorrhagia ideal candidate for her option endometrial ablation for which the transformation was provided. The patient will call back within the next month or 2 to schedule due to the fact that her father has been placed in a retirement home community and she has a lot to take care of at the present time. Meanwhile I have given her a prescription for Megace 40 mg to  take if she has more than one cycle per month for 10 days. The results of the atypical squamous cells of undetermined significance Pap smear with negative HPV was discussed and according to the new guidelines she does not need colposcopy but followup Pap smear 3 years but we will repeat the Pap smear in one year.

## 2014-01-25 ENCOUNTER — Ambulatory Visit
Admission: RE | Admit: 2014-01-25 | Discharge: 2014-01-25 | Disposition: A | Discharge status: Home / Self Care | Payer: 59 | Source: Ambulatory Visit

## 2014-01-25 DIAGNOSIS — Z1231 Encounter for screening mammogram for malignant neoplasm of breast: Secondary | ICD-10-CM

## 2014-02-16 ENCOUNTER — Ambulatory Visit: Payer: 59

## 2014-06-15 ENCOUNTER — Other Ambulatory Visit: Payer: Self-pay | Admitting: Gynecology

## 2014-06-15 ENCOUNTER — Telehealth: Payer: Self-pay

## 2014-06-15 NOTE — Telephone Encounter (Signed)
Wait to see when cycle starts then she can start Megace 40 mg BID for 7 days

## 2014-06-15 NOTE — Telephone Encounter (Signed)
Patient said she has very irregular periods. She said at exam you mentioned that if her period was ever coming on at vacation time you could prescribe something to alter it.  She is in the middle of vacation in [place] and has not had a period in almost 6 weeks and said it is feeling like she could start any minute and wants to see if you could prescribe something to keep it from starting.

## 2014-06-15 NOTE — Telephone Encounter (Signed)
Left detailed message and told her to call me with pharmacy.

## 2014-07-26 ENCOUNTER — Telehealth: Payer: Self-pay | Admitting: *Deleted

## 2014-07-26 NOTE — Telephone Encounter (Signed)
Pt called requesting a low dose birth control pills sent to pharmacy. I called pt back and told her OV would be needed to have Rx. Annual was back in Feb. 2015. Pt aware

## 2014-09-10 ENCOUNTER — Encounter: Payer: Self-pay | Admitting: Gynecology

## 2014-12-21 ENCOUNTER — Other Ambulatory Visit: Payer: Self-pay

## 2014-12-21 DIAGNOSIS — Z1231 Encounter for screening mammogram for malignant neoplasm of breast: Secondary | ICD-10-CM

## 2015-01-28 ENCOUNTER — Ambulatory Visit: Payer: Self-pay

## 2015-02-15 ENCOUNTER — Ambulatory Visit: Payer: Self-pay

## 2015-02-15 ENCOUNTER — Ambulatory Visit
Admission: RE | Admit: 2015-02-15 | Discharge: 2015-02-15 | Disposition: A | Discharge status: Home / Self Care | Payer: 59 | Source: Ambulatory Visit

## 2015-02-15 DIAGNOSIS — Z1231 Encounter for screening mammogram for malignant neoplasm of breast: Secondary | ICD-10-CM

## 2015-12-11 ENCOUNTER — Other Ambulatory Visit: Payer: Self-pay | Admitting: Internal Medicine

## 2015-12-11 DIAGNOSIS — M25552 Pain in left hip: Secondary | ICD-10-CM

## 2015-12-12 ENCOUNTER — Ambulatory Visit
Admission: RE | Admit: 2015-12-12 | Discharge: 2015-12-12 | Disposition: A | Discharge status: Home / Self Care | Payer: 59 | Source: Ambulatory Visit | Attending: Internal Medicine | Admitting: Internal Medicine

## 2015-12-12 DIAGNOSIS — M25552 Pain in left hip: Secondary | ICD-10-CM

## 2016-01-24 NOTE — H&P (Signed)
TOTAL HIP ADMISSION H&P  Patient is admitted for left total hip arthroplasty, anterior approach.  Subjective:  Chief Complaint: Left hip primary OA / pain  HPI: Sandra Stevenson, 50 y.o. female, has a history of pain and functional disability in the left hip(s) due to arthritis and patient has failed non-surgical conservative treatments for greater than 12 weeks to include NSAID's and/or analgesics, corticosteriod injections and activity modification.  Onset of symptoms was gradual starting 1-2 years ago with rapidlly worsening course since that time.The patient noted no past surgery on the left hip(s).  Patient currently rates pain in the left hip at 10 out of 10 with activity. Patient has night pain, worsening of pain with activity and weight bearing, trendelenberg gait, pain that interfers with activities of daily living and pain with passive range of motion. 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 This condition presents safety issues increasing the risk of falls. There is no current active infection.   Risks, benefits and expectations were discussed with the patient.  Risks including but not limited to the risk of anesthesia, blood clots, nerve damage, blood vessel damage, failure of the prosthesis, infection and up to and including death.  Patient understand the risks, benefits and expectations and wishes to proceed with surgery.   PCP: No primary care provider on file.  D/C Plans:      Home  Post-op Meds:       No Rx given  Tranexamic Acid:      To be given - IV   Decadron:      Is to be given  FYI:     ASA  Norco    Patient Active Problem List   Diagnosis Date Noted  . Irregular menses 12/27/2013  . Lupus ([place]) 04/15/2012   Past Medical History  Diagnosis Date  . Lupus   . Hypertension     Past Surgical History  Procedure Laterality Date  . Foot surgery    . Essure tubal ligation      No prescriptions prior  to admission   Allergies  Allergen Reactions  . Plaquenil [Hydroxychloroquine Sulfate] Hives and Itching    Social History  Substance Use Topics  . Smoking status: Never Smoker   . Smokeless tobacco: Not on file  . Alcohol Use: Yes    Family History  Problem Relation Age of Onset  . Hypertension Father   . Stroke Father   . Heart disease Father      Review of Systems  Constitutional: Positive for malaise/fatigue.  Eyes: Positive for blurred vision.  Respiratory: Positive for shortness of breath (on exertion).   Cardiovascular: Negative.   Gastrointestinal: Negative.   Genitourinary: Negative.   Musculoskeletal: Positive for joint pain.  Skin: Positive for rash.  Neurological: Positive for headaches.  Endo/Heme/Allergies: Positive for environmental allergies.  Psychiatric/Behavioral: The patient has insomnia.     Objective:  Physical Exam  Constitutional: She is oriented to person, place, and time. She appears well-developed.  HENT:  Head: Normocephalic.  Eyes: Pupils are equal, round, and reactive to light.  Neck: Neck supple. No JVD present. No tracheal deviation present. No thyromegaly present.  Cardiovascular: Normal rate, regular rhythm, normal heart sounds and intact distal pulses.   Respiratory: Effort normal and breath sounds normal. No stridor. No respiratory distress. She has no wheezes.  GI: Soft. There is no tenderness. There is no guarding.  Musculoskeletal:       Left hip: She  exhibits decreased range of motion, decreased strength, tenderness and bony tenderness. She exhibits no swelling, no deformity and no laceration.  Lymphadenopathy:    She has no cervical adenopathy.  Neurological: She is alert and oriented to person, place, and time.  Skin: Skin is warm and dry.  Psychiatric: She has a normal mood and affect.      Labs:  Estimated body mass index is 26.59 kg/(m^2) as calculated from the following:   Height as of 12/27/13: 5\' 4"  (1.626 m).    Weight as of 12/27/13: 70.308 kg (155 lb).   Imaging Review Plain radiographs demonstrate severe degenerative joint disease of the left hip(s). The bone quality appears to be good for age and reported activity level.  Assessment/Plan:  End stage arthritis, left hip(s)  The patient history, physical examination, clinical judgement of the provider and imaging studies are consistent with end stage degenerative joint disease of the left hip(s) and total hip arthroplasty is deemed medically necessary. The treatment options including medical management, injection therapy, arthroscopy and arthroplasty were discussed at length. The risks and benefits of total hip arthroplasty were presented and reviewed. The risks due to aseptic loosening, infection, stiffness, dislocation/subluxation,  thromboembolic complications and other imponderables were discussed.  The patient acknowledged the explanation, agreed to proceed with the plan and consent was signed. Patient is being admitted for inpatient treatment for surgery, pain control, PT, OT, prophylactic antibiotics, VTE prophylaxis, progressive ambulation and ADL's and discharge planning.The patient is planning to be discharged home with home health services.      West Pugh Fynley Chrystal   PA-C  01/24/2016, 12:06 AM

## 2016-01-30 NOTE — Patient Instructions (Addendum)
Sandra Stevenson  01/30/2016   Your procedure is scheduled on: 02/04/16  Report to [facility]  Entrance take [facility]  elevators to 3rd floor to  [place] at 7:05AM.  Call this number if you have problems the morning of surgery [phone]   Remember: ONLY 1 PERSON MAY GO WITH YOU TO SHORT STAY TO GET  READY MORNING OF [place].  Do not eat food or drink liquids :After Midnight.     Take these medicines the morning of surgery with A SIP OF WATER: Prednisone                                You may not have any metal on your body including hair pins and              piercings  Do not wear jewelry, make-up, lotions, powders or perfumes, deodorant             Do not wear nail polish.  Do not shave  48 hours prior to surgery.             .   Do not bring valuables to the hospital. [place].  Contacts, dentures or bridgework may not be worn into surgery.  Leave suitcase in the car. After surgery it may be brought to your room.                Please read over the following fact sheets you were given: _____________________________________________________________________             [facility] - Preparing for Surgery Before surgery, you can play an important role.  Because skin is not sterile, your skin needs to be as free of germs as possible.  You can reduce the number of germs on your skin by washing with CHG (chlorahexidine gluconate) soap before surgery.  CHG is an antiseptic cleaner which kills germs and bonds with the skin to continue killing germs even after washing. Please DO NOT use if you have an allergy to CHG or antibacterial soaps.  If your skin becomes reddened/irritated stop using the CHG and inform your nurse when you arrive at Short Stay. Do not shave (including legs and underarms) for at least 48 hours prior to the first CHG shower.  You may shave your face/neck. Please follow  these instructions carefully:  1.  Shower with CHG Soap the night before surgery and the  morning of Surgery.  2.  If you choose to wash your hair, wash your hair first as usual with your  normal  shampoo.  3.  After you shampoo, rinse your hair and body thoroughly to remove the  shampoo.                           4.  Use CHG as you would any other liquid soap.  You can apply chg directly  to the skin and wash                       Gently with a scrungie or clean washcloth.  5.  Apply the CHG Soap to your body ONLY FROM THE NECK DOWN.  Do not use on face/ open                           Wound or open sores. Avoid contact with eyes, ears mouth and genitals (private parts).                       Wash face,  Genitals (private parts) with your normal soap.             6.  Wash thoroughly, paying special attention to the area where your surgery  will be performed.  7.  Thoroughly rinse your body with warm water from the neck down.  8.  DO NOT shower/wash with your normal soap after using and rinsing off  the CHG Soap.                9.  Pat yourself dry with a clean towel.            10.  Wear clean pajamas.            11.  Place clean sheets on your bed the night of your first shower and do not  sleep with pets. Day of Surgery : Do not apply any lotions/deodorants the morning of surgery.  Please wear clean clothes to the hospital/surgery center.  FAILURE TO FOLLOW THESE INSTRUCTIONS MAY RESULT IN THE CANCELLATION OF YOUR SURGERY PATIENT SIGNATURE_________________________________  NURSE SIGNATURE__________________________________  ________________________________________________________________________  WHAT IS A BLOOD TRANSFUSION? Blood Transfusion Information  A transfusion is the replacement of blood or some of its parts. Blood is made up of multiple cells which provide different functions.  Red blood cells carry oxygen and are used for blood loss replacement.  White blood cells fight  against infection.  Platelets control bleeding.  Plasma helps clot blood.  Other blood products are available for specialized needs, such as hemophilia or other clotting disorders. BEFORE THE TRANSFUSION  Who gives blood for transfusions?   Healthy volunteers who are fully evaluated to make sure their blood is safe. This is blood bank blood. Transfusion therapy is the safest it has ever been in the practice of medicine. Before blood is taken from a donor, a complete history is taken to make sure that person has no history of diseases nor engages in risky social behavior (examples are intravenous drug use or sexual activity with multiple partners). The donor's travel history is screened to minimize risk of transmitting infections, such as malaria. The donated blood is tested for signs of infectious diseases, such as HIV and hepatitis. The blood is then tested to be sure it is compatible with you in order to minimize the chance of a transfusion reaction. If you or a relative donates blood, this is often done in anticipation of surgery and is not appropriate for emergency situations. It takes many days to process the donated blood. RISKS AND COMPLICATIONS Although transfusion therapy is very safe and saves many lives, the main dangers of transfusion include:  1. Getting an infectious disease. 2. Developing a transfusion reaction. This is an allergic reaction to something in the blood you were given. Every precaution is taken to prevent this. The decision to have a blood transfusion has been considered carefully by your caregiver before blood is given. Blood is not given unless the benefits outweigh the risks. AFTER THE TRANSFUSION  Right after receiving a blood transfusion, you will usually feel much better and more energetic. This is especially  true if your red blood cells have gotten low (anemic). The transfusion raises the level of the red blood cells which carry oxygen, and this usually causes an  energy increase.  The nurse administering the transfusion will monitor you carefully for complications. HOME CARE INSTRUCTIONS  No special instructions are needed after a transfusion. You may find your energy is better. Speak with your caregiver about any limitations on activity for underlying diseases you may have. SEEK MEDICAL CARE IF:   Your condition is not improving after your transfusion.  You develop redness or irritation at the intravenous (IV) site. SEEK IMMEDIATE MEDICAL CARE IF:  Any of the following symptoms occur over the next 12 hours:  Shaking chills.  You have a temperature by mouth above 102 F (38.9 C), not controlled by medicine.  Chest, back, or muscle pain.  People around you feel you are not acting correctly or are confused.  Shortness of breath or difficulty breathing.  Dizziness and fainting.  You get a rash or develop hives.  You have a decrease in urine output.  Your urine turns a dark color or changes to pink, red, or brown. Any of the following symptoms occur over the next 10 days:  You have a temperature by mouth above 102 F (38.9 C), not controlled by medicine.  Shortness of breath.  Weakness after normal activity.  The white part of the eye turns yellow (jaundice).  You have a decrease in the amount of urine or are urinating less often.  Your urine turns a dark color or changes to pink, red, or brown. Document Released: 10/23/2000 Document Revised: 01/18/2012 Document Reviewed: 06/11/2008 ExitCare Patient Information 2014 [place].  _______________________________________________________________________  Incentive Spirometer  An incentive spirometer is a tool that can help keep your lungs clear and active. This tool measures how well you are filling your lungs with each breath. Taking long deep breaths may help reverse or decrease the chance of developing breathing (pulmonary) problems (especially infection) following:  A  long period of time when you are unable to move or be active. BEFORE THE PROCEDURE   If the spirometer includes an indicator to show your best effort, your nurse or respiratory therapist will set it to a desired goal.  If possible, sit up straight or lean slightly forward. Try not to slouch.  Hold the incentive spirometer in an upright position. INSTRUCTIONS FOR USE  3. Sit on the edge of your bed if possible, or sit up as far as you can in bed or on a chair. 4. Hold the incentive spirometer in an upright position. 5. Breathe out normally. 6. Place the mouthpiece in your mouth and seal your lips tightly around it. 7. Breathe in slowly and as deeply as possible, raising the piston or the ball toward the top of the column. 8. Hold your breath for 3-5 seconds or for as long as possible. Allow the piston or ball to fall to the bottom of the column. 9. Remove the mouthpiece from your mouth and breathe out normally. 10. Rest for a few seconds and repeat Steps 1 through 7 at least 10 times every 1-2 hours when you are awake. Take your time and take a few normal breaths between deep breaths. 11. The spirometer may include an indicator to show your best effort. Use the indicator as a goal to work toward during each repetition. 12. After each set of 10 deep breaths, practice coughing to be sure your lungs are clear. If you have  an incision (the cut made at the time of surgery), support your incision when coughing by placing a pillow or rolled up towels firmly against it. Once you are able to get out of bed, walk around indoors and cough well. You may stop using the incentive spirometer when instructed by your caregiver.  RISKS AND COMPLICATIONS  Take your time so you do not get dizzy or light-headed.  If you are in pain, you may need to take or ask for pain medication before doing incentive spirometry. It is harder to take a deep breath if you are having pain. AFTER USE  Rest and breathe slowly and  easily.  It can be helpful to keep track of a log of your progress. Your caregiver can provide you with a simple table to help with this. If you are using the spirometer at home, follow these instructions: [place] IF:   You are having difficultly using the spirometer.  You have trouble using the spirometer as often as instructed.  Your pain medication is not giving enough relief while using the spirometer.  You develop fever of 100.5 F (38.1 C) or higher. SEEK IMMEDIATE MEDICAL CARE IF:   You cough up bloody sputum that had not been present before.  You develop fever of 102 F (38.9 C) or greater.  You develop worsening pain at or near the incision site. MAKE SURE YOU:   Understand these instructions.  Will watch your condition.  Will get help right away if you are not doing well or get worse. Document Released: 03/08/2007 Document Revised: 01/18/2012 Document Reviewed: 05/09/2007 [facility] Patient Information 2014 [place], [place].   ________________________________________________________________________

## 2016-01-31 ENCOUNTER — Encounter (HOSPITAL_COMMUNITY): Payer: Self-pay

## 2016-01-31 ENCOUNTER — Other Ambulatory Visit (HOSPITAL_COMMUNITY): Payer: 59

## 2016-01-31 ENCOUNTER — Encounter (HOSPITAL_COMMUNITY)
Admission: RE | Admit: 2016-01-31 | Discharge: 2016-01-31 | Disposition: A | Discharge status: Home / Self Care | Payer: 59 | Source: Ambulatory Visit | Attending: Orthopedic Surgery | Admitting: Orthopedic Surgery

## 2016-01-31 DIAGNOSIS — Z01812 Encounter for preprocedural laboratory examination: Secondary | ICD-10-CM | POA: Diagnosis not present

## 2016-01-31 HISTORY — DX: Family history of other specified conditions: Z84.89

## 2016-01-31 HISTORY — DX: Disease of pericardium, unspecified: I31.9

## 2016-01-31 LAB — URINALYSIS, ROUTINE W REFLEX MICROSCOPIC
BILIRUBIN URINE: NEGATIVE
GLUCOSE, UA: NEGATIVE mg/dL
HGB URINE DIPSTICK: NEGATIVE
Ketones, ur: NEGATIVE mg/dL
Leukocytes, UA: NEGATIVE
Nitrite: NEGATIVE
PH: 6 (ref 5.0–8.0)
Protein, ur: NEGATIVE mg/dL
SPECIFIC GRAVITY, URINE: 1.021 (ref 1.005–1.030)

## 2016-01-31 LAB — APTT: APTT: 25 s (ref 24–37)

## 2016-01-31 LAB — HCG, SERUM, QUALITATIVE: Preg, Serum: NEGATIVE

## 2016-01-31 LAB — PROTIME-INR
INR: 1.18 (ref 0.00–1.49)
PROTHROMBIN TIME: 14.8 s (ref 11.6–15.2)

## 2016-01-31 LAB — SURGICAL PCR SCREEN
MRSA, PCR: NEGATIVE
Staphylococcus aureus: NEGATIVE

## 2016-01-31 LAB — ABO/RH: ABO/RH(D): A POS

## 2016-01-31 NOTE — Pre-Procedure Instructions (Addendum)
Clearance, Dr. Criss Rosales 01/18/16 on chart CBC with Diff, CMP, Hgb A1C 01/16/16 on chart EKG 01/18/16 on chart  Pt is on Telmisartan-Amlodipine at home.  Will put in order for 5mg  Amlodipine for morning of surgery.  Spoke with Dr. Glennon Mac regarding pt taking Azathioprine morning of surgery. She stated it is okay to take. Told pt to take AM of surgery.

## 2016-02-04 ENCOUNTER — Encounter (HOSPITAL_COMMUNITY): Payer: Self-pay | Admitting: *Deleted

## 2016-02-04 ENCOUNTER — Inpatient Hospital Stay (HOSPITAL_COMMUNITY): Payer: 59

## 2016-02-04 ENCOUNTER — Inpatient Hospital Stay (HOSPITAL_COMMUNITY)
Admission: RE | Admit: 2016-02-04 | Discharge: 2016-02-06 | DRG: 470 | Disposition: A | Discharge status: Home Health | Payer: 59 | Source: Ambulatory Visit | Attending: Orthopedic Surgery | Admitting: Orthopedic Surgery

## 2016-02-04 ENCOUNTER — Inpatient Hospital Stay (HOSPITAL_COMMUNITY): Payer: 59 | Admitting: Anesthesiology

## 2016-02-04 ENCOUNTER — Encounter (HOSPITAL_COMMUNITY)
Admission: RE | Disposition: A | Discharge status: Home Health | Payer: Self-pay | Source: Ambulatory Visit | Attending: Orthopedic Surgery

## 2016-02-04 DIAGNOSIS — M87052 Idiopathic aseptic necrosis of left femur: Principal | ICD-10-CM | POA: Diagnosis present

## 2016-02-04 DIAGNOSIS — M329 Systemic lupus erythematosus, unspecified: Secondary | ICD-10-CM | POA: Diagnosis present

## 2016-02-04 DIAGNOSIS — E663 Overweight: Secondary | ICD-10-CM | POA: Diagnosis present

## 2016-02-04 DIAGNOSIS — M25562 Pain in left knee: Secondary | ICD-10-CM | POA: Diagnosis present

## 2016-02-04 DIAGNOSIS — Z96649 Presence of unspecified artificial hip joint: Secondary | ICD-10-CM

## 2016-02-04 DIAGNOSIS — I10 Essential (primary) hypertension: Secondary | ICD-10-CM | POA: Diagnosis present

## 2016-02-04 DIAGNOSIS — Z01812 Encounter for preprocedural laboratory examination: Secondary | ICD-10-CM

## 2016-02-04 DIAGNOSIS — M1612 Unilateral primary osteoarthritis, left hip: Secondary | ICD-10-CM | POA: Diagnosis present

## 2016-02-04 DIAGNOSIS — Z6826 Body mass index (BMI) 26.0-26.9, adult: Secondary | ICD-10-CM

## 2016-02-04 HISTORY — PX: TOTAL HIP ARTHROPLASTY: SHX124

## 2016-02-04 LAB — TYPE AND SCREEN
ABO/RH(D): A POS
ANTIBODY SCREEN: NEGATIVE

## 2016-02-04 SURGERY — ARTHROPLASTY, HIP, TOTAL, ANTERIOR APPROACH
Anesthesia: General | Site: Hip | Laterality: Left

## 2016-02-04 MED ORDER — PROPOFOL 10 MG/ML IV BOLUS
INTRAVENOUS | Status: DC | PRN
  Administered 2016-02-04: 50 mg via INTRAVENOUS
  Administered 2016-02-04: 150 mg via INTRAVENOUS

## 2016-02-04 MED ORDER — ONDANSETRON HCL 4 MG/2ML IJ SOLN: 4.0000 mg | Freq: Four times a day (QID) | INTRAMUSCULAR | Status: DC | PRN

## 2016-02-04 MED ORDER — CHLORHEXIDINE GLUCONATE 4 % EX LIQD: 60.0000 mL | Freq: Once | CUTANEOUS | Status: DC

## 2016-02-04 MED ORDER — PROPOFOL 10 MG/ML IV BOLUS
INTRAVENOUS | Status: AC
  Filled 2016-02-04: qty 40

## 2016-02-04 MED ORDER — CEFAZOLIN SODIUM-DEXTROSE 2-3 GM-% IV SOLR
INTRAVENOUS | Status: AC
  Filled 2016-02-04: qty 50

## 2016-02-04 MED ORDER — SUGAMMADEX SODIUM 200 MG/2ML IV SOLN
INTRAVENOUS | Status: DC | PRN
  Administered 2016-02-04: 200 mg via INTRAVENOUS

## 2016-02-04 MED ORDER — FENTANYL CITRATE (PF) 100 MCG/2ML IJ SOLN
INTRAMUSCULAR | Status: DC | PRN
  Administered 2016-02-04 (×5): 50 ug via INTRAVENOUS

## 2016-02-04 MED ORDER — DIPHENHYDRAMINE HCL 25 MG PO CAPS: 25.0000 mg | ORAL_CAPSULE | Freq: Four times a day (QID) | ORAL | Status: DC | PRN

## 2016-02-04 MED ORDER — HYDROCHLOROTHIAZIDE 25 MG PO TABS
25.0000 mg | ORAL_TABLET | Freq: Every day | ORAL | Status: DC
  Administered 2016-02-04 – 2016-02-05 (×2): 25 mg via ORAL
  Filled 2016-02-04 (×3): qty 1

## 2016-02-04 MED ORDER — AMLODIPINE BESYLATE 5 MG PO TABS
5.0000 mg | ORAL_TABLET | Freq: Once | ORAL | Status: AC
  Administered 2016-02-04: 5 mg via ORAL
  Filled 2016-02-04: qty 1

## 2016-02-04 MED ORDER — BISACODYL 10 MG RE SUPP: 10.0000 mg | Freq: Every day | RECTAL | Status: DC | PRN

## 2016-02-04 MED ORDER — TELMISARTAN-AMLODIPINE 80-5 MG PO TABS: 1.0000 | ORAL_TABLET | Freq: Every day | ORAL | Status: DC

## 2016-02-04 MED ORDER — ONDANSETRON HCL 4 MG/2ML IJ SOLN
INTRAMUSCULAR | Status: AC
  Filled 2016-02-04: qty 2

## 2016-02-04 MED ORDER — METHOCARBAMOL 500 MG PO TABS
500.0000 mg | ORAL_TABLET | Freq: Four times a day (QID) | ORAL | Status: DC | PRN
  Administered 2016-02-04 – 2016-02-06 (×4): 500 mg via ORAL
  Filled 2016-02-04 (×4): qty 1

## 2016-02-04 MED ORDER — FENTANYL CITRATE (PF) 250 MCG/5ML IJ SOLN
INTRAMUSCULAR | Status: AC
  Filled 2016-02-04: qty 5

## 2016-02-04 MED ORDER — DEXAMETHASONE SODIUM PHOSPHATE 10 MG/ML IJ SOLN
INTRAMUSCULAR | Status: AC
  Filled 2016-02-04: qty 1

## 2016-02-04 MED ORDER — ONDANSETRON HCL 4 MG PO TABS
4.0000 mg | ORAL_TABLET | Freq: Four times a day (QID) | ORAL | Status: DC | PRN
  Administered 2016-02-05: 4 mg via ORAL
  Filled 2016-02-04: qty 1

## 2016-02-04 MED ORDER — LACTATED RINGERS IV SOLN
INTRAVENOUS | Status: DC
  Administered 2016-02-04: 11:00:00 via INTRAVENOUS
  Administered 2016-02-04: 1000 mL via INTRAVENOUS

## 2016-02-04 MED ORDER — METOCLOPRAMIDE HCL 5 MG/ML IJ SOLN: 5.0000 mg | Freq: Three times a day (TID) | INTRAMUSCULAR | Status: DC | PRN

## 2016-02-04 MED ORDER — CELECOXIB 200 MG PO CAPS
200.0000 mg | ORAL_CAPSULE | Freq: Two times a day (BID) | ORAL | Status: DC
  Administered 2016-02-04 – 2016-02-06 (×5): 200 mg via ORAL
  Filled 2016-02-04 (×9): qty 1

## 2016-02-04 MED ORDER — DOCUSATE SODIUM 100 MG PO CAPS
100.0000 mg | ORAL_CAPSULE | Freq: Two times a day (BID) | ORAL | Status: DC
  Administered 2016-02-04 – 2016-02-06 (×4): 100 mg via ORAL

## 2016-02-04 MED ORDER — POLYETHYLENE GLYCOL 3350 17 G PO PACK
17.0000 g | PACK | Freq: Two times a day (BID) | ORAL | Status: DC
  Administered 2016-02-04 – 2016-02-06 (×4): 17 g via ORAL

## 2016-02-04 MED ORDER — SODIUM CHLORIDE 0.9 % IV SOLN
1000.0000 mg | Freq: Once | INTRAVENOUS | Status: AC
  Administered 2016-02-04: 1000 mg via INTRAVENOUS
  Filled 2016-02-04: qty 10

## 2016-02-04 MED ORDER — ROCURONIUM BROMIDE 100 MG/10ML IV SOLN
INTRAVENOUS | Status: DC | PRN
Start: 2016-02-04 — End: 2016-02-04
  Administered 2016-02-04: 10 mg via INTRAVENOUS
  Administered 2016-02-04: 50 mg via INTRAVENOUS

## 2016-02-04 MED ORDER — MIDAZOLAM HCL 5 MG/5ML IJ SOLN
INTRAMUSCULAR | Status: DC | PRN
  Administered 2016-02-04: 2 mg via INTRAVENOUS

## 2016-02-04 MED ORDER — HYDROMORPHONE HCL 1 MG/ML IJ SOLN
0.5000 mg | INTRAMUSCULAR | Status: DC | PRN
  Administered 2016-02-04 (×2): 1 mg via INTRAVENOUS
  Filled 2016-02-04 (×2): qty 1

## 2016-02-04 MED ORDER — DEXAMETHASONE SODIUM PHOSPHATE 4 MG/ML IJ SOLN
INTRAMUSCULAR | Status: DC | PRN
  Administered 2016-02-04: 10 mg via INTRAVENOUS

## 2016-02-04 MED ORDER — PREDNISONE 5 MG PO TABS
5.0000 mg | ORAL_TABLET | Freq: Every day | ORAL | Status: DC
  Administered 2016-02-05 – 2016-02-06 (×2): 5 mg via ORAL
  Filled 2016-02-04 (×3): qty 1

## 2016-02-04 MED ORDER — LIDOCAINE HCL (CARDIAC) 20 MG/ML IV SOLN
INTRAVENOUS | Status: AC
  Filled 2016-02-04: qty 5

## 2016-02-04 MED ORDER — MAGNESIUM CITRATE PO SOLN: 1.0000 | Freq: Once | ORAL | Status: DC | PRN

## 2016-02-04 MED ORDER — DEXAMETHASONE SODIUM PHOSPHATE 10 MG/ML IJ SOLN
10.0000 mg | Freq: Once | INTRAMUSCULAR | Status: AC
  Administered 2016-02-05: 10 mg via INTRAVENOUS
  Filled 2016-02-04: qty 1

## 2016-02-04 MED ORDER — HYDROMORPHONE HCL 1 MG/ML IJ SOLN: 0.2500 mg | INTRAMUSCULAR | Status: DC | PRN

## 2016-02-04 MED ORDER — HYDROMORPHONE HCL 1 MG/ML IJ SOLN
INTRAMUSCULAR | Status: AC
  Administered 2016-02-04: 1 mg via INTRAVENOUS
  Filled 2016-02-04: qty 2

## 2016-02-04 MED ORDER — IRBESARTAN 300 MG PO TABS
300.0000 mg | ORAL_TABLET | Freq: Every day | ORAL | Status: DC
  Administered 2016-02-05: 300 mg via ORAL
  Filled 2016-02-04 (×3): qty 1

## 2016-02-04 MED ORDER — CEFAZOLIN SODIUM-DEXTROSE 2-4 GM/100ML-% IV SOLN
2.0000 g | Freq: Four times a day (QID) | INTRAVENOUS | Status: AC
  Administered 2016-02-04 (×2): 2 g via INTRAVENOUS
  Filled 2016-02-04 (×2): qty 100

## 2016-02-04 MED ORDER — PHENOL 1.4 % MT LIQD
1.0000 | OROMUCOSAL | Status: DC | PRN
  Filled 2016-02-04: qty 177

## 2016-02-04 MED ORDER — ONDANSETRON HCL 4 MG/2ML IJ SOLN
INTRAMUSCULAR | Status: DC | PRN
  Administered 2016-02-04: 4 mg via INTRAVENOUS

## 2016-02-04 MED ORDER — DEXTROSE 5 % IV SOLN
2.0000 g | INTRAVENOUS | Status: AC
  Administered 2016-02-04: 2 g via INTRAVENOUS
  Filled 2016-02-04: qty 20

## 2016-02-04 MED ORDER — ACETAMINOPHEN 10 MG/ML IV SOLN
1000.0000 mg | Freq: Once | INTRAVENOUS | Status: AC
  Administered 2016-02-04: 1000 mg via INTRAVENOUS

## 2016-02-04 MED ORDER — SODIUM CHLORIDE 0.9 % IR SOLN
Status: DC | PRN
  Administered 2016-02-04: 1000 mL

## 2016-02-04 MED ORDER — SODIUM CHLORIDE 0.9 % IV SOLN
100.0000 mL/h | INTRAVENOUS | Status: DC
  Administered 2016-02-04 – 2016-02-05 (×2): 100 mL/h via INTRAVENOUS
  Filled 2016-02-04 (×7): qty 1000

## 2016-02-04 MED ORDER — FERROUS SULFATE 325 (65 FE) MG PO TABS
325.0000 mg | ORAL_TABLET | Freq: Three times a day (TID) | ORAL | Status: DC
  Administered 2016-02-06: 325 mg via ORAL
  Filled 2016-02-04 (×8): qty 1

## 2016-02-04 MED ORDER — HYDROCODONE-ACETAMINOPHEN 7.5-325 MG PO TABS
1.0000 | ORAL_TABLET | ORAL | Status: DC
  Administered 2016-02-04 – 2016-02-05 (×6): 2 via ORAL
  Administered 2016-02-05: 1 via ORAL
  Administered 2016-02-06 (×2): 2 via ORAL
  Filled 2016-02-04 (×10): qty 2

## 2016-02-04 MED ORDER — HYDROMORPHONE HCL 1 MG/ML IJ SOLN
INTRAMUSCULAR | Status: AC
  Filled 2016-02-04: qty 1

## 2016-02-04 MED ORDER — MENTHOL 3 MG MT LOZG: 1.0000 | LOZENGE | OROMUCOSAL | Status: DC | PRN

## 2016-02-04 MED ORDER — METOCLOPRAMIDE HCL 5 MG PO TABS
5.0000 mg | ORAL_TABLET | Freq: Three times a day (TID) | ORAL | Status: DC | PRN
  Filled 2016-02-04: qty 2

## 2016-02-04 MED ORDER — ALUM & MAG HYDROXIDE-SIMETH 200-200-20 MG/5ML PO SUSP: 30.0000 mL | ORAL | Status: DC | PRN

## 2016-02-04 MED ORDER — ROCURONIUM BROMIDE 100 MG/10ML IV SOLN
INTRAVENOUS | Status: AC
  Filled 2016-02-04: qty 1

## 2016-02-04 MED ORDER — DEXAMETHASONE SODIUM PHOSPHATE 10 MG/ML IJ SOLN: 10.0000 mg | Freq: Once | INTRAMUSCULAR | Status: DC

## 2016-02-04 MED ORDER — METHOCARBAMOL 1000 MG/10ML IJ SOLN
500.0000 mg | Freq: Four times a day (QID) | INTRAMUSCULAR | Status: DC | PRN
  Administered 2016-02-04: 500 mg via INTRAVENOUS
  Filled 2016-02-04 (×2): qty 5

## 2016-02-04 MED ORDER — LACTATED RINGERS IV SOLN: INTRAVENOUS | Status: DC

## 2016-02-04 MED ORDER — AMLODIPINE BESYLATE 5 MG PO TABS
5.0000 mg | ORAL_TABLET | Freq: Every day | ORAL | Status: DC
  Administered 2016-02-05: 5 mg via ORAL
  Filled 2016-02-04 (×3): qty 1

## 2016-02-04 MED ORDER — HYDROMORPHONE HCL 1 MG/ML IJ SOLN
0.2500 mg | INTRAMUSCULAR | Status: DC | PRN
  Administered 2016-02-04 (×7): 0.5 mg via INTRAVENOUS

## 2016-02-04 MED ORDER — ACETAMINOPHEN 10 MG/ML IV SOLN
INTRAVENOUS | Status: AC
  Filled 2016-02-04: qty 100

## 2016-02-04 MED ORDER — ASPIRIN EC 325 MG PO TBEC
325.0000 mg | DELAYED_RELEASE_TABLET | Freq: Two times a day (BID) | ORAL | Status: DC
  Administered 2016-02-05 – 2016-02-06 (×3): 325 mg via ORAL
  Filled 2016-02-04 (×5): qty 1

## 2016-02-04 MED ORDER — MIDAZOLAM HCL 2 MG/2ML IJ SOLN
INTRAMUSCULAR | Status: AC
  Filled 2016-02-04: qty 2

## 2016-02-04 MED ORDER — TRANEXAMIC ACID 1000 MG/10ML IV SOLN
1000.0000 mg | Freq: Once | INTRAVENOUS | Status: AC
  Administered 2016-02-04: 1000 mg via INTRAVENOUS
  Filled 2016-02-04: qty 10

## 2016-02-04 SURGICAL SUPPLY — 34 items
BAG DECANTER FOR FLEXI CONT (MISCELLANEOUS) IMPLANT
BAG ZIPLOCK 12X15 (MISCELLANEOUS) IMPLANT
CAPT HIP TOTAL 3 ×3 IMPLANT
CLOTH BEACON ORANGE TIMEOUT ST (SAFETY) ×3 IMPLANT
COVER PERINEAL POST (MISCELLANEOUS) ×3 IMPLANT
DRAPE STERI IOBAN 125X83 (DRAPES) ×3 IMPLANT
DRAPE U-SHAPE 47X51 STRL (DRAPES) ×6 IMPLANT
DRSG AQUACEL AG ADV 3.5X10 (GAUZE/BANDAGES/DRESSINGS) ×3 IMPLANT
DURAPREP 26ML APPLICATOR (WOUND CARE) ×3 IMPLANT
ELECT REM PT RETURN 15FT ADLT (MISCELLANEOUS) IMPLANT
ELECT REM PT RETURN 9FT ADLT (ELECTROSURGICAL) ×3
ELECTRODE REM PT RTRN 9FT ADLT (ELECTROSURGICAL) ×1 IMPLANT
GLOVE BIOGEL M 7.0 STRL (GLOVE) IMPLANT
GLOVE BIOGEL PI IND STRL 7.5 (GLOVE) ×1 IMPLANT
GLOVE BIOGEL PI IND STRL 8.5 (GLOVE) ×1 IMPLANT
GLOVE BIOGEL PI INDICATOR 7.5 (GLOVE) ×2
GLOVE BIOGEL PI INDICATOR 8.5 (GLOVE) ×2
GLOVE ECLIPSE 8.0 STRL XLNG CF (GLOVE) ×6 IMPLANT
GLOVE ORTHO TXT STRL SZ7.5 (GLOVE) ×3 IMPLANT
GOWN STRL REUS W/TWL LRG LVL3 (GOWN DISPOSABLE) ×3 IMPLANT
GOWN STRL REUS W/TWL XL LVL3 (GOWN DISPOSABLE) ×3 IMPLANT
HOLDER FOLEY CATH W/STRAP (MISCELLANEOUS) ×3 IMPLANT
LIQUID BAND (GAUZE/BANDAGES/DRESSINGS) ×3 IMPLANT
PACK ANTERIOR HIP CUSTOM (KITS) ×3 IMPLANT
SAW OSC TIP CART 19.5X105X1.3 (SAW) ×3 IMPLANT
SUT MNCRL AB 4-0 PS2 18 (SUTURE) ×3 IMPLANT
SUT VIC AB 1 CT1 36 (SUTURE) ×9 IMPLANT
SUT VIC AB 2-0 CT1 27 (SUTURE) ×4
SUT VIC AB 2-0 CT1 TAPERPNT 27 (SUTURE) ×2 IMPLANT
SUT VLOC 180 0 24IN GS25 (SUTURE) ×3 IMPLANT
TRAY FOLEY W/METER SILVER 14FR (SET/KITS/TRAYS/PACK) IMPLANT
TRAY FOLEY W/METER SILVER 16FR (SET/KITS/TRAYS/PACK) IMPLANT
WATER STERILE IRR 1500ML POUR (IV SOLUTION) ×3 IMPLANT
YANKAUER SUCT BULB TIP 10FT TU (MISCELLANEOUS) IMPLANT

## 2016-02-04 NOTE — Transfer of Care (Signed)
Immediate Anesthesia Transfer of Care Note  Patient: Sandra Stevenson  Procedure(s) Performed: Procedure(s): LEFT TOTAL HIP ARTHROPLASTY ANTERIOR APPROACH (Left)  Patient Location: PACU  Anesthesia Type:General  Level of Consciousness: awake, sedated and responds to stimulation  Airway & Oxygen Therapy: Patient Spontanous Breathing and Patient connected to face mask oxygen  Post-op Assessment: Report given to RN and Post -op Vital signs reviewed and stable  Post vital signs: Reviewed and stable  Last Vitals:  Filed Vitals:   02/04/16 0805  BP: 115/60  Pulse: 91  Temp: 36.6 C  Resp: 18    Complications: No apparent anesthesia complications

## 2016-02-04 NOTE — Anesthesia Procedure Notes (Signed)
Procedure Name: Intubation Date/Time: 02/04/2016 10:15 AM Performed by: Deliah Boston Pre-anesthesia Checklist: Patient identified, Emergency Drugs available, Suction available and Patient being monitored Patient Re-evaluated:Patient Re-evaluated prior to inductionOxygen Delivery Method: Circle System Utilized Preoxygenation: Pre-oxygenation with 100% oxygen Intubation Type: IV induction Ventilation: Mask ventilation without difficulty Laryngoscope Size: Mac and 4 Grade View: Grade II Tube type: Oral Tube size: 7.0 mm Number of attempts: 3 Placement Confirmation: ETT inserted through vocal cords under direct vision,  positive ETCO2 and breath sounds checked- equal and bilateral Secured at: 21 cm Tube secured with: Tape Dental Injury: Teeth and Oropharynx as per pre-operative assessment  Comments: DVL x 1 by Marya Amsler EMT student, with MAC 3 blade, no view. DVL x 1 by CRNA with mac 3, not able to reach epiglottis, DVL x1 by CRNA with mac4, grade 2 view

## 2016-02-04 NOTE — Anesthesia Preprocedure Evaluation (Addendum)
Anesthesia Evaluation  Patient identified by MRN, date of birth, ID band Patient awake    Reviewed: Allergy & Precautions, H&P , NPO status , Patient's Chart, lab work & pertinent test results  Airway Mallampati: II  TM Distance: >3 FB Neck ROM: full    Dental no notable dental hx. (+) Dental Advisory Given, Teeth Intact   Pulmonary neg pulmonary ROS,    Pulmonary exam normal breath sounds clear to auscultation       Cardiovascular Exercise Tolerance: Good hypertension, Pt. on medications Normal cardiovascular exam Rhythm:regular Rate:Normal  Pericarditis 2014   Neuro/Psych negative neurological ROS  negative psych ROS   GI/Hepatic negative GI ROS, Neg liver ROS,   Endo/Other  negative endocrine ROS  Renal/GU negative Renal ROS  negative genitourinary   Musculoskeletal   Abdominal   Peds  Hematology negative hematology ROS (+)   Anesthesia Other Findings Lupus  Reproductive/Obstetrics negative OB ROS                           Anesthesia Physical Anesthesia Plan  ASA: III  Anesthesia Plan: General   Post-op Pain Management:    Induction: Intravenous  Airway Management Planned: Oral ETT  Additional Equipment:   Intra-op Plan:   Post-operative Plan: Extubation in OR  Informed Consent: I have reviewed the patients History and Physical, chart, labs and discussed the procedure including the risks, benefits and alternatives for the proposed anesthesia with the patient or authorized representative who has indicated his/her understanding and acceptance.   Dental Advisory Given  Plan Discussed with: CRNA and Surgeon  Anesthesia Plan Comments:        Anesthesia Quick Evaluation

## 2016-02-04 NOTE — Interval H&P Note (Signed)
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 After consideration of risks, benefits and other options for treatment, the patient has consented to  Procedure(s): LEFT TOTAL HIP ARTHROPLASTY ANTERIOR APPROACH (Left) as a surgical intervention .  The patient's history has been reviewed, patient examined, no change in status, stable for surgery.  I have reviewed the patient's chart and labs.  Questions were answered to the patient's satisfaction.     Mauri Pole

## 2016-02-04 NOTE — Discharge Instructions (Addendum)

## 2016-02-04 NOTE — Anesthesia Postprocedure Evaluation (Signed)
Anesthesia Post Note  Patient: Sandra Stevenson  Procedure(s) Performed: Procedure(s) (LRB): LEFT TOTAL HIP ARTHROPLASTY ANTERIOR APPROACH (Left)  Patient location during evaluation: PACU Anesthesia Type: General Level of consciousness: awake and alert Pain management: pain level controlled Vital Signs Assessment: post-procedure vital signs reviewed and stable Respiratory status: spontaneous breathing, nonlabored ventilation, respiratory function stable and patient connected to nasal cannula oxygen Cardiovascular status: blood pressure returned to baseline and stable Postop Assessment: no signs of nausea or vomiting Anesthetic complications: no    Last Vitals:  Filed Vitals:   02/04/16 1330 02/04/16 1351  BP: 122/77 121/72  Pulse: 69 73  Temp:  36.6 C  Resp: 11 14    Last Pain:  Filed Vitals:   02/04/16 1353  PainSc: 4                  Trystin Terhune L

## 2016-02-04 NOTE — Op Note (Signed)
NAME:  Sandra Stevenson NO.: [id number]      MEDICAL RECORD NO.: [id number]      FACILITY:  [facility]      PHYSICIAN:  Paralee Cancel D  DATE OF BIRTH:  [birth year]     DATE OF PROCEDURE:  02/04/2016                                 OPERATIVE REPORT         PREOPERATIVE DIAGNOSIS: Left  hip idiopathic avascular necrosis      POSTOPERATIVE DIAGNOSIS:  Left hip idiopathic avascular necrosis.      PROCEDURE:  Left total hip replacement through an anterior approach   utilizing DePuy THR system, component size 7mm pinnacle cup, a size 28 neutral   Ceramax ceramic liner, a size 5 Hi Tri Lock stem with a 28+5 delta ceramic   ball.      SURGEON:  Pietro Cassis. Alvan Dame, M.D.      ASSISTANT:  Danae Orleans, PA-C      ANESTHESIA:  Spinal.      SPECIMENS:  None.      COMPLICATIONS:  None.      BLOOD LOSS:  250 cc     DRAINS:  None.      INDICATION OF THE PROCEDURE:  Sandra Stevenson is a 50 y.o. female who had   presented to office for evaluation of left hip pain.  Radiographs revealed   avascular necrosis with greater than 75 percent involvement of the femoral head.  degenerative changes with bone-on-bone   articulation to the  hip joint.  The patient had painful limited range of   motion pain with all weight bearing activities that was significantly affecting their overall quality of life.  The patient was failing to    respond to conservative measures, and at this point was ready   to proceed with more definitive measures.  The patient has noted progressive problems and dysfunction   with regarding the hip prior to surgery.  Consent was obtained for   benefit of pain relief.  Specific risk of infection, DVT, component   failure, dislocation, need for revision surgery, as well discussion of   the anterior versus posterior approach were reviewed.  Consent was   obtained for benefit of anterior pain relief through an anterior   approach.       PROCEDURE IN DETAIL:  The patient was brought to operative theater.   Once adequate anesthesia, preoperative antibiotics, 2gm of Ancef, 1 gm of Tranexamic Acid, and 10 mg of Decadron administered.   The patient was positioned supine on the OSI Hanna table.  Once adequate   padding of boney process was carried out, we had predraped out the hip, and  used fluoroscopy to confirm orientation of the pelvis and position.      The left hip was then prepped and draped from proximal iliac crest to   mid thigh with shower curtain technique.      Time-out was performed identifying the patient, planned procedure, and   extremity.     An incision was then made 2 cm distal and lateral to the   anterior superior iliac spine extending over the orientation of the   tensor fascia lata muscle and sharp dissection was carried down to the   fascia  of the muscle and protractor placed in the soft tissues.      The fascia was then incised.  The muscle belly was identified and swept   laterally and retractor placed along the superior neck.  Following   cauterization of the circumflex vessels and removing some pericapsular   fat, a second cobra retractor was placed on the inferior neck.  A third   retractor was placed on the anterior acetabulum after elevating the   anterior rectus.  A L-capsulotomy was along the line of the   superior neck to the trochanteric fossa, then extended proximally and   distally.  Tag sutures were placed and the retractors were then placed   intracapsular.  We then identified the trochanteric fossa and   orientation of my neck cut, confirmed this radiographically   and then made a neck osteotomy with the femur on traction.  The femoral   head was removed without difficulty or complication.  Traction was let   off and retractors were placed posterior and anterior around the   acetabulum.      The labrum and foveal tissue were debrided.  I began reaming with a 28mm   reamer and  reamed up to 15mm reamer with good bony bed preparation and a 30mm   cup was chosen.  The final 32mm Pinnacle cup was then impacted under fluoroscopy  to confirm the depth of penetration and orientation with respect to   abduction.  A screw was placed followed by the hole eliminator.  The final   110mm Ceramax ceramic liner was impacted with good visualized rim fit.  The cup was positioned anatomically within the acetabular portion of the pelvis.      At this point, the femur was rolled at 80 degrees.  Further capsule was   released off the inferior aspect of the femoral neck.  I then   released the superior capsule proximally.  The hook was placed laterally   along the femur and elevated manually and held in position with the bed   hook.  The leg was then extended and adducted with the leg rolled to 100   degrees of external rotation.  Once the proximal femur was fully   exposed, I used a box osteotome to set orientation.  I then began   broaching with the starting chili pepper broach and passed this by hand and then broached up to 5.  With the 5 broach in place I chose a high offset neck and did several trial reductions.  The offset was appropriate, leg lengths   appeared to be equal best matched with +5 head ball confirmed radiographically.   Given these findings, I went ahead and dislocated the hip, repositioned all   retractors and positioned the right hip in the extended and abducted position.  The final 5Hi Tri Lock stem was   chosen and it was impacted down to the level of neck cut.  Based on this   and the trial reduction, a 28+5 delta ceramic ball was chosen and   impacted onto a clean and dry trunnion, and the hip was reduced.  The   hip had been irrigated throughout the case again at this point.  I did   reapproximate the superior capsular leaflet to the anterior leaflet   using #1 Vicryl.  The fascia of the   tensor fascia lata muscle was then reapproximated using #1 Vicryl and #0  V-lock sutures.  The   remaining wound was  closed with 2-0 Vicryl and running 4-0 Monocryl.   The hip was cleaned, dried, and dressed sterilely using Dermabond and   Aquacel dressing.  She was then brought   to recovery room in stable condition tolerating the procedure well.    Danae Orleans, PA-C was present for the entirety of the case involved from   preoperative positioning, perioperative retractor management, general   facilitation of the case, as well as primary wound closure as assistant.            Pietro Cassis Alvan Dame, M.D.        02/04/2016 11:40 AM

## 2016-02-05 DIAGNOSIS — E663 Overweight: Secondary | ICD-10-CM | POA: Diagnosis present

## 2016-02-05 LAB — CBC
HCT: 24.7 % — ABNORMAL LOW (ref 36.0–46.0)
Hemoglobin: 8.8 g/dL — ABNORMAL LOW (ref 12.0–15.0)
MCH: 29.6 pg (ref 26.0–34.0)
MCHC: 35.6 g/dL (ref 30.0–36.0)
MCV: 83.2 fL (ref 78.0–100.0)
PLATELETS: 192 10*3/uL (ref 150–400)
RBC: 2.97 MIL/uL — ABNORMAL LOW (ref 3.87–5.11)
RDW: 13.8 % (ref 11.5–15.5)
WBC: 6.2 10*3/uL (ref 4.0–10.5)

## 2016-02-05 LAB — BASIC METABOLIC PANEL
Anion gap: 8 (ref 5–15)
BUN: 16 mg/dL (ref 6–20)
CO2: 25 mmol/L (ref 22–32)
Calcium: 8.1 mg/dL — ABNORMAL LOW (ref 8.9–10.3)
Chloride: 110 mmol/L (ref 101–111)
Creatinine, Ser: 0.73 mg/dL (ref 0.44–1.00)
GFR calc Af Amer: >60 mL/min (ref >60)
GLUCOSE: 122 mg/dL — AB (ref 65–99)
POTASSIUM: 3.9 mmol/L (ref 3.5–5.1)
SODIUM: 143 mmol/L (ref 135–145)

## 2016-02-05 MED ORDER — FERROUS SULFATE 325 (65 FE) MG PO TABS: 325.0000 mg | ORAL_TABLET | Freq: Three times a day (TID) | ORAL | Status: DC

## 2016-02-05 MED ORDER — METHOCARBAMOL 500 MG PO TABS
500.0000 mg | ORAL_TABLET | Freq: Four times a day (QID) | ORAL | Status: DC | PRN
Start: 2016-02-05 — End: 2016-12-22

## 2016-02-05 MED ORDER — ASPIRIN 325 MG PO TBEC: 325.0000 mg | DELAYED_RELEASE_TABLET | Freq: Two times a day (BID) | ORAL | Status: AC

## 2016-02-05 MED ORDER — HYDROCODONE-ACETAMINOPHEN 7.5-325 MG PO TABS: 1.0000 | ORAL_TABLET | ORAL | Status: DC | PRN

## 2016-02-05 MED ORDER — POLYETHYLENE GLYCOL 3350 17 G PO PACK: 17.0000 g | PACK | Freq: Two times a day (BID) | ORAL | Status: DC

## 2016-02-05 MED ORDER — DOCUSATE SODIUM 100 MG PO CAPS: 100.0000 mg | ORAL_CAPSULE | Freq: Two times a day (BID) | ORAL | Status: DC

## 2016-02-05 NOTE — Evaluation (Signed)
Physical Therapy Evaluation Patient Details Name: Sandra Stevenson MRN: [id number] DOB: 1966/11/06 Today's Date: 02/05/2016   History of Present Illness  s/p L DA THA due to AVN.  H/o Lupus  Clinical Impression  Pt continues motivated but moving slowly and with increased time for all tasks.    Follow Up Recommendations Home health PT    Equipment Recommendations  Rolling walker with 5" wheels    Recommendations for Other Services OT consult     Precautions / Restrictions Precautions Precautions: Fall Restrictions Weight Bearing Restrictions: No      Mobility  Bed Mobility Overal bed mobility: Needs Assistance Bed Mobility: Sit to Supine       Sit to supine: Min assist   General bed mobility comments: Increased time with cues for sequence and use of R LE to self assist  Transfers Overall transfer level: Needs assistance Equipment used: Rolling walker (2 wheeled) Transfers: Sit to/from Stand Sit to Stand: Min assist         General transfer comment: cues for LE management and use of UEs to self assist  Ambulation/Gait Ambulation/Gait assistance: Min assist;Min guard Ambulation Distance (Feet): 88 Feet Assistive device: Rolling walker (2 wheeled) Gait Pattern/deviations: Step-to pattern;Step-through pattern;Decreased step length - right;Decreased step length - left;Shuffle;Trunk flexed Gait velocity: decr   General Gait Details: Increased time with cues for sequence, posture, stride length , position from RW, and ER on L  Stairs            Wheelchair Mobility    Modified Rankin (Stroke Patients Only)       Balance                                             Pertinent Vitals/Pain Pain Assessment: 0-10 Pain Score: 4  Pain Location: L hip Pain Descriptors / Indicators: Aching;Sore Pain Intervention(s): Limited activity within patient's tolerance;Monitored during session;Premedicated before session    Home Living                         Prior Function                 Hand Dominance        Extremity/Trunk Assessment                         Communication      Cognition Arousal/Alertness: Awake/alert Behavior During Therapy: WFL for tasks assessed/performed Overall Cognitive Status: Within Functional Limits for tasks assessed                      General Comments      Exercises        Assessment/Plan    PT Assessment    PT Diagnosis     PT Problem List    PT Treatment Interventions     PT Goals (Current goals can be found in the Care Plan section) Acute Rehab PT Goals Patient Stated Goal: get back to power walking PT Goal Formulation: With patient Time For Goal Achievement: 02/08/16 Potential to Achieve Goals: Good    Frequency 7X/week   Barriers to discharge        Co-evaluation               End of Session Equipment Utilized During Treatment: Gait belt Activity  Tolerance: Patient tolerated treatment well Patient left: in bed;with call bell/phone within reach;with family/visitor present Nurse Communication: Mobility status         Time: 1340-1410 PT Time Calculation (min) (ACUTE ONLY): 30 min   Charges:     PT Treatments $Gait Training: 23-37 mins   PT G Codes:        Sandra Stevenson 02-17-16, 4:08 PM

## 2016-02-05 NOTE — Evaluation (Signed)
Occupational Therapy Evaluation Patient Details Name: Sandra Stevenson MRN: [id number] DOB: 1966/04/12 Today's Date: 02/05/2016    History of Present Illness s/p L DA THA due to AVN.  H/o Lupus   Clinical Impression   This 50 year old female was admitted for the above.  Will follow in acute to increase safety and independence with adls.  Goals in acute are for mostly supervision level. She currently needs min to mod A.    Follow Up Recommendations  No OT follow up;Supervision/Assistance - 24 hour    Equipment Recommendations  3 in 1 bedside comode (delivered)    Recommendations for Other Services       Precautions / Restrictions Precautions Precautions: Fall Restrictions Weight Bearing Restrictions: No      Mobility Bed Mobility               General bed mobility comments: oob  Transfers Overall transfer level: Needs assistance Equipment used: Rolling walker (2 wheeled) Transfers: Sit to/from Stand Sit to Stand: Min guard         General transfer comment: cues when sitting for UE and LE placement    Balance                                            ADL Overall ADL's : Needs assistance/impaired     Grooming: Set up;Sitting   Upper Body Bathing: Set up;Sitting   Lower Body Bathing: Minimal assistance;Sit to/from stand;With adaptive equipment   Upper Body Dressing : Set up;Sitting   Lower Body Dressing: Moderate assistance;Sit to/from stand;With adaptive equipment   Toilet Transfer: Minimal assistance;Ambulation;BSC;RW   Toileting- Water quality scientist and Hygiene: Min guard;Sit to/from stand         General ADL Comments: pt had one LOB walking to bathroom; very minimal assistance given to steady.  She plans to get a reacher for home.  She doesn't think she'll need any other AE. She will have help til Sunday then intermittently.  She plans to sponge bathe until she can step into tub.  Educated on tub bench if needed.  Pt  tends to internally rotate and hike hip when walking to clear foot     Vision     Perception     Praxis      Pertinent Vitals/Pain Pain Assessment: 0-10 Pain Score: 2  Pain Location: L hip Pain Descriptors / Indicators: Aching Pain Intervention(s): Limited activity within patient's tolerance;Monitored during session;Premedicated before session;Repositioned;Ice applied     Hand Dominance     Extremity/Trunk Assessment Upper Extremity Assessment Upper Extremity Assessment: Overall WFL for tasks assessed           Communication Communication Communication: No difficulties   Cognition Arousal/Alertness: Awake/alert Behavior During Therapy: WFL for tasks assessed/performed Overall Cognitive Status: Within Functional Limits for tasks assessed                     General Comments       Exercises       Shoulder Instructions      Home Living Family/patient expects to be discharged to:: Private residence Living Arrangements: Alone Available Help at Discharge: Family               Bathroom Shower/Tub: Tub/shower unit;Curtain Shower/tub characteristics: Architectural technologist: Standard  Prior Functioning/Environment Level of Independence: Independent        Comments: did without socks due to pain    OT Diagnosis: Generalized weakness   OT Problem List: Decreased activity tolerance;Decreased knowledge of use of DME or AE;Pain   OT Treatment/Interventions: Self-care/ADL training;DME and/or AE instruction;Patient/family education    OT Goals(Current goals can be found in the care plan section) Acute Rehab OT Goals Patient Stated Goal: get back to power walking OT Goal Formulation: With patient Time For Goal Achievement: 02/12/16 Potential to Achieve Goals: Good ADL Goals Pt Will Perform Grooming: with supervision;standing Pt Will Perform Lower Body Bathing: with supervision;with adaptive equipment;sit to/from stand Pt Will  Perform Lower Body Dressing: with supervision;with adaptive equipment;sit to/from stand Pt Will Transfer to Toilet: with supervision;ambulating;bedside commode Pt Will Perform Toileting - Clothing Manipulation and hygiene: with supervision;sit to/from stand  OT Frequency: Min 2X/week   Barriers to D/C:            Co-evaluation              End of Session    Activity Tolerance: Patient limited by fatigue (fatiques easily) Patient left: in chair;with call bell/phone within reach;with chair alarm set   Time: 1022-1047 OT Time Calculation (min): 25 min Charges:  OT General Charges $OT Visit: 1 Procedure OT Evaluation $OT Eval Low Complexity: 1 Procedure OT Treatments $Self Care/Home Management : 8-22 mins G-Codes:    Gabino Hagin Feb 28, 2016, 12:17 PM  Lesle Chris, OTR/L [phone] Feb 28, 2016

## 2016-02-05 NOTE — Care Management Note (Signed)
Case Management Note  Patient Details  Name: Sandra Stevenson MRN: [id number] Date of Birth: 1966/09/20  Subjective/Objective:                  LEFT TOTAL HIP ARTHROPLASTY ANTERIOR APPROACH (Left)  Action/Plan: Discharge planning Expected Discharge Date:  02/05/16              Expected Discharge Plan:  [place]  In-House Referral:     Discharge planning Services  CM Consult  Post Acute Care Choice:  Home Health Choice offered to:  Patient  DME Arranged:  3-N-1, Walker rolling DME Agency:  [place]:  PT [place] Agency:  [place]  Status of Service:  Completed, signed off  Medicare Important Message Given:    Date Medicare IM Given:    Medicare IM give by:    Date Additional Medicare IM Given:    Additional Medicare Important Message give by:     If discussed at [place] of Stay Meetings, dates discussed:    Additional Comments: CM met with pt in room to offer choice of home health agency.  Pt chooses [facility] to render HHPT.  Referral given to [employer], Tim.  CM called [facility] DME rep, Colletta [place] to please deliver the rolling walker and 3n1 to room prior to discharge.  No other CM needs were communicated. Dellie Catholic, RN 02/05/2016, 10:20 AM

## 2016-02-05 NOTE — Evaluation (Signed)
Physical Therapy Evaluation Patient Details Name: Sandra Stevenson MRN: [id number] DOB: Feb 13, 1966 Today's Date: 02/05/2016   History of Present Illness  s/p L DA THA due to AVN.  H/o Lupus  Clinical Impression  Pt s/p L THR presents with decreased L LE strength/ROM and post op pain limiting functional mobility.  Pt should progress to dc home with family assist and HHPT follow up.    Follow Up Recommendations Home health PT    Equipment Recommendations  Rolling walker with 5" wheels    Recommendations for Other Services OT consult     Precautions / Restrictions Precautions Precautions: Fall Restrictions Weight Bearing Restrictions: No      Mobility  Bed Mobility Overal bed mobility: Needs Assistance Bed Mobility: Supine to Sit     Supine to sit: Min assist     General bed mobility comments: Increased time with cues for sequence and use of R LE to self assist  Transfers Overall transfer level: Needs assistance Equipment used: Rolling walker (2 wheeled) Transfers: Sit to/from Stand Sit to Stand: Min assist         General transfer comment: cues for LE management and use of UEs to self assist  Ambulation/Gait Ambulation/Gait assistance: Min assist Ambulation Distance (Feet): 58 Feet Assistive device: Rolling walker (2 wheeled) Gait Pattern/deviations: Step-to pattern;Decreased step length - right;Decreased step length - left;Shuffle;Trunk flexed Gait velocity: decr Gait velocity interpretation: Below normal speed for age/gender General Gait Details: cues for sequence, posture, stride length , position from RW, and ER on L  Stairs            Wheelchair Mobility    Modified Rankin (Stroke Patients Only)       Balance                                             Pertinent Vitals/Pain Pain Assessment: 0-10 Pain Score: 2  Pain Location: L hip Pain Descriptors / Indicators: Aching;Sore Pain Intervention(s): Limited activity  within patient's tolerance;Monitored during session;Premedicated before session;Ice applied    Home Living Family/patient expects to be discharged to:: Private residence Living Arrangements: Alone Available Help at Discharge: Family Type of Home: House Home Access: Stairs to enter   Technical brewer of Steps: 1+1 Home Layout: Two level;Bed/bath upstairs Home Equipment: None      Prior Function Level of Independence: Independent         Comments: did without socks due to pain     Hand Dominance        Extremity/Trunk Assessment   Upper Extremity Assessment: Overall WFL for tasks assessed           Lower Extremity Assessment: LLE deficits/detail   LLE Deficits / Details: Strength at hip 2/5 with AAROM at hip to 90 flex and 10 abd  Cervical / Trunk Assessment: Normal  Communication   Communication: No difficulties  Cognition Arousal/Alertness: Awake/alert Behavior During Therapy: WFL for tasks assessed/performed Overall Cognitive Status: Within Functional Limits for tasks assessed                      General Comments      Exercises Total Joint Exercises Ankle Circles/Pumps: AROM;Both;15 reps;Supine Quad Sets: AROM;Both;10 reps;Supine Heel Slides: AAROM;Left;20 reps;Supine Hip ABduction/ADduction: AAROM;Left;15 reps;Supine      Assessment/Plan    PT Assessment Patient needs continued PT services  PT Diagnosis  Difficulty walking   PT Problem List Decreased strength;Decreased range of motion;Decreased activity tolerance;Decreased mobility;Decreased knowledge of use of DME;Pain  PT Treatment Interventions DME instruction;Gait training;Stair training;Functional mobility training;Therapeutic activities;Therapeutic exercise;Patient/family education   PT Goals (Current goals can be found in the Care Plan section) Acute Rehab PT Goals Patient Stated Goal: get back to power walking PT Goal Formulation: With patient Time For Goal Achievement:  02/08/16 Potential to Achieve Goals: Good    Frequency 7X/week   Barriers to discharge        Co-evaluation               End of Session Equipment Utilized During Treatment: Gait belt Activity Tolerance: Patient tolerated treatment well Patient left: in chair;with call bell/phone within reach;with chair alarm set Nurse Communication: Mobility status         Time: [id number] PT Time Calculation (min) (ACUTE ONLY): 43 min   Charges:   PT Evaluation $PT Eval Low Complexity: 1 Procedure PT Treatments $Gait Training: 8-22 mins $Therapeutic Exercise: 8-22 mins   PT G Codes:        Taijon Vink Feb 22, 2016, 12:44 PM

## 2016-02-05 NOTE — Progress Notes (Signed)
     Subjective: 1 Day Post-Op Procedure(s) (LRB): LEFT TOTAL HIP ARTHROPLASTY ANTERIOR APPROACH (Left)   Patient reports pain as moderate, pain controlled. Feels that the 1 Norco didn't fully control the pain.  She hasn't worked with PT yet and looking forward to doing so.  Ready to be discharged home if her pain is controled and she does well with PT.   Objective:   VITALS:   Filed Vitals:   02/05/16 0308 02/05/16 0545  BP: 115/65 112/61  Pulse: 83 79  Temp: 98.3 F (36.8 C) 98.6 F (37 C)  Resp: 16 15    Dorsiflexion/Plantar flexion intact Incision: dressing C/D/I No cellulitis present Compartment soft  LABS  Recent Labs  02/05/16 0409  HGB 8.8*  HCT 24.7*  WBC 6.2  PLT 192     Recent Labs  02/05/16 0409  NA 143  K 3.9  BUN 16  CREATININE 0.73  GLUCOSE 122*     Assessment/Plan: 1 Day Post-Op Procedure(s) (LRB): LEFT TOTAL HIP ARTHROPLASTY ANTERIOR APPROACH (Left) Foley cath d/c'ed Advance diet Up with therapy D/C IV fluids Discharge home with home health Follow up in 2 weeks at [facility]. Follow up with OLIN,Nichol Ator D in 2 weeks.  Contact information:  [facility] [address] [place] [id number]     Overweight (BMI 25-29.9) Estimated body mass index is 26.42 kg/(m^2) as calculated from the following:   Height as of this encounter: 5\' 4"  (1.626 m).   Weight as of this encounter: 69.854 kg (154 lb). Patient also counseled that weight may inhibit the healing process Patient counseled that losing weight will help with future health issues       West Pugh. Justene Jensen   PAC  02/05/2016, 8:52 AM

## 2016-02-05 NOTE — Progress Notes (Signed)
[facility]  Patient Status: New  [facility] is providing the following services: rolling walker and bedside (3in1) commode, delivered to room on 02/05/16.  Sandra Stevenson 02/05/2016, 3:49 PM

## 2016-02-06 LAB — CBC
HCT: 21.2 % — ABNORMAL LOW (ref 36.0–46.0)
Hemoglobin: 7.6 g/dL — ABNORMAL LOW (ref 12.0–15.0)
MCH: 29.6 pg (ref 26.0–34.0)
MCHC: 35.8 g/dL (ref 30.0–36.0)
MCV: 82.5 fL (ref 78.0–100.0)
Platelets: 174 10*3/uL (ref 150–400)
RBC: 2.57 MIL/uL — ABNORMAL LOW (ref 3.87–5.11)
RDW: 13.8 % (ref 11.5–15.5)
WBC: 6.9 10*3/uL (ref 4.0–10.5)

## 2016-02-06 LAB — BASIC METABOLIC PANEL
ANION GAP: 7 (ref 5–15)
BUN: 17 mg/dL (ref 6–20)
CO2: 26 mmol/L (ref 22–32)
Calcium: 8 mg/dL — ABNORMAL LOW (ref 8.9–10.3)
Chloride: 111 mmol/L (ref 101–111)
Creatinine, Ser: 0.75 mg/dL (ref 0.44–1.00)
GFR calc Af Amer: >60 mL/min (ref >60)
GFR calc non Af Amer: >60 mL/min (ref >60)
GLUCOSE: 157 mg/dL — AB (ref 65–99)
POTASSIUM: 3.3 mmol/L — AB (ref 3.5–5.1)
Sodium: 144 mmol/L (ref 135–145)

## 2016-02-06 NOTE — Progress Notes (Signed)
     Subjective: 2 Days Post-Op Procedure(s) (LRB): LEFT TOTAL HIP ARTHROPLASTY ANTERIOR APPROACH (Left)   Patient reports pain as mild, pain well controlled. She feels that she is much better today than she was yesterday.  No events throughout the night.  Ready to be discharged home.  Objective:   VITALS:   Filed Vitals:   02/05/16 2136 02/06/16 0529  BP: 133/84 104/55  Pulse: 82 77  Temp: 98.7 F (37.1 C) 98.4 F (36.9 C)  Resp: 16 16    Dorsiflexion/Plantar flexion intact Incision: dressing C/D/I No cellulitis present Compartment soft  LABS  Recent Labs  02/05/16 0409 02/06/16 0410  HGB 8.8* 7.6*  HCT 24.7* 21.2*  WBC 6.2 6.9  PLT 192 174     Recent Labs  02/05/16 0409 02/06/16 0410  NA 143 144  K 3.9 3.3*  BUN 16 17  CREATININE 0.73 0.75  GLUCOSE 122* 157*     Assessment/Plan: 2 Days Post-Op Procedure(s) (LRB): LEFT TOTAL HIP ARTHROPLASTY ANTERIOR APPROACH (Left) Up with therapy Discharge home with home health  Follow up in 2 weeks at [facility]. Follow up with OLIN,Kindsey Eblin D in 2 weeks.  Contact information:  [facility] [address] [place] Sandra Stevenson   PAC  02/06/2016, 7:55 AM

## 2016-02-06 NOTE — Progress Notes (Signed)
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  H/o Lupus   OT comments  Pt making very good progress. Will not need post acute follow up  Follow Up Recommendations  No OT follow up;Supervision/Assistance - 24 hour    Equipment Recommendations  3 in 1 bedside comode    Recommendations for Other Services      Precautions / Restrictions Precautions Precautions: Fall Restrictions Weight Bearing Restrictions: No       Mobility Bed Mobility               General bed mobility comments: oob  Transfers   Equipment used: Rolling walker (2 wheeled) Transfers: Sit to/from Stand Sit to Stand: Supervision         General transfer comment: cues for UE/LE placement    Balance                                   ADL       Grooming: Oral care;Wash/dry hands;Wash/dry face;Standing;Supervision/safety       Lower Body Bathing: Minimal assistance;Sit to/from stand;With adaptive equipment       Lower Body Dressing: Minimal assistance;With adaptive equipment;Sit to/from stand   Toilet Transfer: Supervision/safety;BSC;RW;Ambulation   Toileting- Water quality scientist and Hygiene: Supervision/safety;Sit to/from stand         General ADL Comments: used reacher and sock aide.  Educated on how to use reacher to remove sock, hold wash cloth for lotion.  She has a long sponge for bathing.  Less compensation when ambulating to bathroom      Vision                     Perception     Praxis      Cognition   Behavior During Therapy: [facility] for tasks assessed/performed Overall Cognitive Status: Within Functional Limits for tasks assessed                       Extremity/Trunk Assessment               Exercises     Shoulder Instructions       General Comments      Pertinent Vitals/ Pain        Pain Score: 4  Pain Location: L hip Pain Descriptors / Indicators: Aching Pain Intervention(s): Limited activity within patient's tolerance;Monitored during session;Premedicated before session;Repositioned  Home Living                                          Prior Functioning/Environment              Frequency       Progress Toward Goals  OT Goals(current goals can now be found in the care plan section)  Progress towards OT goals: Progressing toward goals (will not need follow up OT)     Plan      Co-evaluation                 End of Session     Activity Tolerance Patient tolerated treatment well   Patient Left in chair;with call bell/phone within reach;with chair alarm set;with family/visitor present   Nurse  Communication          Time: [id number] OT Time Calculation (min): 34 min  Charges:  self care, 23-37 minutes.  Yarissa Reining 02/06/2016, 10:38 AM  Lesle Chris, OTR/L [phone] 02/06/2016

## 2016-02-06 NOTE — Progress Notes (Signed)
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  H/o Lupus    PT Comments    Pt progressing well with mobility and eager for dc home.  Reviewed therex, car transfers and stairs with written instructions provided.  Follow Up Recommendations  Home health PT     Equipment Recommendations  Rolling walker with 5" wheels    Recommendations for Other Services OT consult     Precautions / Restrictions Precautions Precautions: Fall Restrictions Weight Bearing Restrictions: No    Mobility  Bed Mobility Overal bed mobility: Needs Assistance Bed Mobility: Sit to Supine     Supine to sit: Supervision     General bed mobility comments: Pt self assisting L LE with belt  Transfers Overall transfer level: Needs assistance Equipment used: Rolling walker (2 wheeled) Transfers: Sit to/from Stand Sit to Stand: Supervision         General transfer comment: cues for UE/LE placement  Ambulation/Gait Ambulation/Gait assistance: Min guard;Supervision Ambulation Distance (Feet): 90 Feet Assistive device: Rolling walker (2 wheeled) Gait Pattern/deviations: Step-to pattern;Shuffle;Decreased step length - right;Decreased step length - left;Trunk flexed Gait velocity: decr   General Gait Details: Min cues for posture and position from [employer] Stairs: Yes Stairs assistance: Min assist Stair Management: No rails;One rail Left;Forwards;With crutches;Step to pattern;With walker Number of Stairs: 8 General stair comments: 4 steps with rail and crutch and 4 steps with bil crutches and single step fwd wtih RW  Wheelchair Mobility    Modified Rankin (Stroke Patients Only)       Balance                                    Cognition Arousal/Alertness: Awake/alert Behavior During Therapy: WFL for tasks assessed/performed Overall  Cognitive Status: Within Functional Limits for tasks assessed                      Exercises Total Joint Exercises Ankle Circles/Pumps: AROM;Both;15 reps;Supine Quad Sets: AROM;Both;10 reps;Supine Heel Slides: AAROM;Left;20 reps;Supine Hip ABduction/ADduction: AAROM;Left;15 reps;Supine    General Comments        Pertinent Vitals/Pain Pain Assessment: 0-10 Pain Score: 4  Pain Location: L hip Pain Descriptors / Indicators: Aching;Sore Pain Intervention(s): Limited activity within patient's tolerance;Monitored during session;Premedicated before session;Ice applied    Home Living                      Prior Function            PT Goals (current goals can now be found in the care plan section) Acute Rehab PT Goals Patient Stated Goal: get back to power walking PT Goal Formulation: With patient Time For Goal Achievement: 02/08/16 Potential to Achieve Goals: Good Progress towards PT goals: Progressing toward goals    Frequency  7X/week    PT Plan Current plan remains appropriate    Co-evaluation             End of Session Equipment Utilized During Treatment: Gait belt Activity Tolerance: Patient tolerated treatment well Patient left: in chair;with call bell/phone within reach     Time: 0913-0951 PT Time Calculation (min) (ACUTE ONLY): 38 min  Charges:  $Gait Training: 8-22 mins $Therapeutic Exercise: 8-22 mins $Therapeutic Activity: 8-22 mins  G Codes:      Hercules Hasler 02-29-16, 12:36 PM

## 2016-02-07 NOTE — Discharge Summary (Signed)
Physician Discharge Summary  Patient ID: Sandra Stevenson MRN: [id number] DOB/AGE: [birth year] 48 y.o.  Admit date: 02/04/2016 Discharge date: 02/06/2016   Procedures:  Procedure(s) (LRB): LEFT TOTAL HIP ARTHROPLASTY ANTERIOR APPROACH (Left)  Attending Physician:  Dr. Paralee Cancel   Admission Diagnoses:   Left hip primary OA / pain  Discharge Diagnoses:  Principal Problem:   S/P left THA, AA Active Problems:   Overweight (BMI 25.0-29.9)  Past Medical History  Diagnosis Date  . Lupus ([place])   . Hypertension   . Family history of adverse reaction to anesthesia     mother's BP dropped after anesthesia, and post op N/V  . Pericarditis 2014    HPI:    Sandra Stevenson, 50 y.o. female, has a history of pain and functional disability in the left hip(s) due to arthritis and patient has failed non-surgical conservative treatments for greater than 12 weeks to include NSAID's and/or analgesics, corticosteriod injections and activity modification. Onset of symptoms was gradual starting 1-2 years ago with rapidlly worsening course since that time.The patient noted no past surgery on the left hip(s). Patient currently rates pain in the left hip at 10 out of 10 with activity. Patient has night pain, worsening of pain with activity and weight bearing, trendelenberg gait, pain that interfers with activities of daily living and pain with passive range of motion. 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 This condition presents safety issues increasing the risk of falls.There is no current active infection. Risks, benefits and expectations were discussed with the patient. Risks including but not limited to the risk of anesthesia, blood clots, nerve damage, blood vessel damage, failure of the prosthesis, infection and up to and including death. Patient understand the risks, benefits and expectations and wishes to proceed with surgery.    PCP: Elyn Peers, MD   Discharged Condition: good  Hospital Course:  Patient underwent the above stated procedure on 02/04/2016. Patient tolerated the procedure well and brought to the recovery room in good condition and subsequently to the floor.  POD #1 BP: 112/61 ; Pulse: 79 ; Temp: 98.6 F (37 C) ; Resp: 15 Patient reports pain as moderate, pain controlled. Feels that the 1 Norco didn't fully control the pain. She hasn't worked with PT yet and looking forward to doing so. Dorsiflexion/plantar flexion intact, incision: dressing C/D/I, no cellulitis present and compartment soft.   LABS  Basename    HGB     8.8  HCT     24.7   POD #2  BP: 104/55 ; Pulse: 77 ; Temp: 98.4 F (36.9 C) ; Resp: 16 Patient reports pain as mild, pain well controlled. She feels that she is much better today than she was yesterday. No events throughout the night. Ready to be discharged home. Dorsiflexion/plantar flexion intact, incision: dressing C/D/I, no cellulitis present and compartment soft.   LABS  Basename    HGB     7.6  HCT     21.2     Discharge Exam: General appearance: alert, cooperative and no distress Extremities: Homans sign is negative, no sign of DVT, no edema, redness or tenderness in the calves or thighs and no ulcers, gangrene or trophic changes  Disposition: Home with follow up in 2 weeks   Follow-up Information    Follow up with Mauri Pole, MD. Schedule an appointment as soon as possible for a visit in 2 weeks.   Specialty:  Orthopedic Surgery  Contact information:   [address] [place] [address] [phone]       Follow up with [facility].   Why:  home health physical therapy   Contact information:   [place] [address] [phone]       Follow up with [facility].   Why:  rolling walker and 3n1   Contact information:   [address] [place]  [address] [phone]       Discharge Instructions    Call MD / Call 911    Complete by:  As directed   If you experience chest pain or shortness of breath, CALL 911 and be transported to the hospital emergency room.  If you develope a fever above 101 F, pus (white drainage) or increased drainage or redness at the wound, or calf pain, call your surgeon's office.     Change dressing    Complete by:  As directed   Maintain surgical dressing until follow up in the clinic. If the edges start to pull up, may reinforce with tape. If the dressing is no longer working, may remove and cover with gauze and tape, but must keep the area dry and clean.  Call with any questions or concerns.     Constipation Prevention    Complete by:  As directed   Drink plenty of fluids.  Prune juice may be helpful.  You may use a stool softener, such as Colace (over the counter) 100 mg twice a day.  Use MiraLax (over the counter) for constipation as needed.     Diet - low sodium heart healthy    Complete by:  As directed      Discharge instructions    Complete by:  As directed   Maintain surgical dressing until follow up in the clinic. If the edges start to pull up, may reinforce with tape. If the dressing is no longer working, may remove and cover with gauze and tape, but must keep the area dry and clean.  Follow up in 2 weeks at [facility]. Call with any questions or concerns.     Increase activity slowly as tolerated    Complete by:  As directed   Weight bearing as tolerated with assist device (walker, cane, etc) as directed, use it as long as suggested by your surgeon or therapist, typically at least 4-6 weeks.     TED hose    Complete by:  As directed   Use stockings (TED hose) for 2 weeks on both leg(s).  You may remove them at night for sleeping.             Medication List    STOP taking these medications        ibuprofen 200 MG tablet  Commonly known as:  ADVIL,MOTRIN     megestrol 40 MG  tablet  Commonly known as:  MEGACE     traMADol 50 MG tablet  Commonly known as:  ULTRAM      TAKE these medications        aspirin 325 MG EC tablet  Take 1 tablet (325 mg total) by mouth 2 (two) times daily.     azaTHIOprine 50 MG tablet  Commonly known as:  IMURAN  Take 50-100 mg by mouth 2 (two) times daily. TAKE 2 TABLETS BY MOUTH IN THE MORNING AND 1 TABLET BY MOUTH AT [facility]     CALCIUM PO  Take  1 tablet by mouth daily.     docusate sodium 100 MG capsule  Commonly known as:  COLACE  Take 1 capsule (100 mg total) by mouth 2 (two) times daily.     ferrous sulfate 325 (65 FE) MG tablet  Take 1 tablet (325 mg total) by mouth 3 (three) times daily after meals.     hydrochlorothiazide 25 MG tablet  Commonly known as:  HYDRODIURIL  Take 25 mg by mouth daily. For Blood pressure     HYDROcodone-acetaminophen 7.5-325 MG tablet  Commonly known as:  NORCO  Take 1-2 tablets by mouth every 4 (four) hours as needed for moderate pain.     methocarbamol 500 MG tablet  Commonly known as:  ROBAXIN  Take 1 tablet (500 mg total) by mouth every 6 (six) hours as needed for muscle spasms.     multivitamin with minerals Tabs tablet  Take 1 tablet by mouth daily.     polyethylene glycol packet  Commonly known as:  MIRALAX / GLYCOLAX  Take 17 g by mouth 2 (two) times daily.     predniSONE 5 MG tablet  Commonly known as:  DELTASONE  Take 5 mg by mouth daily.     Telmisartan-Amlodipine 80-5 MG Tabs  Take 1 tablet by mouth daily.         Signed: West Pugh. Stela Iwasaki   PA-C  02/07/2016, 7:20 AM

## 2016-12-22 ENCOUNTER — Encounter: Payer: Self-pay | Admitting: Diagnostic Neuroimaging

## 2016-12-22 ENCOUNTER — Ambulatory Visit (INDEPENDENT_AMBULATORY_CARE_PROVIDER_SITE_OTHER): Payer: 59 | Admitting: Diagnostic Neuroimaging

## 2016-12-22 VITALS — BP 108/88 | HR 88 | Ht 65.0 in | Wt 162.2 lb

## 2016-12-22 DIAGNOSIS — R51 Headache: Secondary | ICD-10-CM | POA: Diagnosis not present

## 2016-12-22 DIAGNOSIS — M5481 Occipital neuralgia: Secondary | ICD-10-CM

## 2016-12-22 DIAGNOSIS — Z8249 Family history of ischemic heart disease and other diseases of the circulatory system: Secondary | ICD-10-CM

## 2016-12-22 DIAGNOSIS — G44209 Tension-type headache, unspecified, not intractable: Secondary | ICD-10-CM

## 2016-12-22 DIAGNOSIS — R519 Headache, unspecified: Secondary | ICD-10-CM

## 2016-12-22 NOTE — Patient Instructions (Signed)
Thank you for coming to see [place] at [facility]. I hope we have been able to provide you high quality care today.  You may receive a patient satisfaction survey over the next few weeks. We would appreciate your feedback and comments so that we may continue to improve ourselves and the health of our patients.   - check MRI brain and MRA head   - ibuprofen and tylenol as needed for headaches  - To prevent or relieve headaches, try the following:  Cool Compress. Lie down and place a cool compress on your head.   Avoid headache triggers. If certain foods or odors seem to have triggered your migraines in the past, avoid them. A headache diary might help you identify triggers.   Include physical activity in your daily routine.   Manage stress. Find healthy ways to cope with the stressors, such as delegating tasks on your to-do list.   Practice relaxation techniques. Try deep breathing, yoga, massage and visualization.   Eat regularly. Eating regularly scheduled meals and maintaining a healthy diet might help prevent headaches. Also, drink plenty of fluids.   Follow a regular sleep schedule. Sleep deprivation might contribute to headaches  Consider biofeedback. With this mind-body technique, you learn to control certain bodily functions - such as muscle tension, heart rate and blood pressure - to prevent headaches or reduce headache pain.    ~~~~~~~~~~~~~~~~~~~~~~~~~~~~~~~~~~~~~~~~~~~~~~~~~~~~~~~~~~~~~~~~~  DR. Travell Desaulniers'S GUIDE TO HAPPY AND HEALTHY LIVING These are some of my general health and wellness recommendations. Some of them may apply to you better than others. Please use common sense as you try these suggestions and feel free to ask me any questions.   ACTIVITY/FITNESS Mental, social, emotional and physical stimulation are very important for brain and body health. Try learning a new activity (arts, music, language, sports, games).  Keep moving your body  to the best of your abilities. You can do this at home, inside or outside, the park, community center, gym or anywhere you like. Consider a physical therapist or personal trainer to get started. Consider the app Sworkit. Fitness trackers such as smart-watches, smart-phones or Fitbits can help as well.   NUTRITION Eat more plants: colorful vegetables, nuts, seeds and berries.  Eat less sugar, salt, preservatives and processed foods.  Avoid toxins such as cigarettes and alcohol.  Drink water when you are thirsty. Warm water with a slice of lemon is an excellent morning drink to start the day.  Consider these websites for more information The Nutrition Source (https://www.henry-hernandez.biz/) Precision Nutrition (WindowBlog.ch)   RELAXATION Consider practicing mindfulness meditation or other relaxation techniques such as deep breathing, prayer, yoga, tai chi, massage. See website mindful.org or the apps Headspace or Calm to help get started.   SLEEP Try to get at least 7-8+ hours sleep per day. Regular exercise and reduced caffeine will help you sleep better. Practice good sleep hygeine techniques. See website sleep.org for more information.   PLANNING Prepare estate planning, living will, healthcare POA documents. Sometimes this is best planned with the help of an attorney. Theconversationproject.org and agingwithdignity.org are excellent resources.

## 2016-12-22 NOTE — Progress Notes (Signed)
[facility]  PATIENT: Sandra Stevenson DOB: 08-11-1966  REFERRING CLINICIAN: Clayburn Pert HISTORY FROM: patient  REASON FOR VISIT: new consult    HISTORICAL  CHIEF COMPLAINT:  Chief Complaint  Patient presents with  . Headache    rm 7, New Pt, "having sharp pain in middle of back of head to middle of neck at least once a week; blurry vision at least once a week, not assoc w/headaches"     HISTORY OF PRESENT ILLNESS:   51 year old female with history of lupus, here for evaluation of headaches. Patient reports 4-5 months of right parietal, right occipital and right neck shooting pain lasting for 5 minutes. Attacks occur suddenly without warning. She is has approximate 1 attack per week. In addition for the past 1 year she's had daily low-grade tension squeezing headaches, sometimes associated with elevated blood pressure. She typically takes ibuprofen for these. No nausea or vomiting. No photophobia or phonophobia. Patient has family history of brain aneurysm in her mother and she is concerned about the similar diagnosis for herself. No family history of migraine headaches. No personal history of migraine headache. No other recent triggering, aggravating factors.   REVIEW OF SYSTEMS: Full 14 system review of systems performed and negative with exception of: Fatigue blurred vision double vision shortness of breath easy bruising joint pain anxiety memory loss headache insomnia.  ALLERGIES: Allergies  Allergen Reactions  . Plaquenil [Hydroxychloroquine Sulfate] Hives and Itching    HOME MEDICATIONS: Outpatient Medications Prior to Visit  Medication Sig Dispense Refill  . azaTHIOprine (IMURAN) 50 MG tablet Take 50-100 mg by mouth 2 (two) times daily. TAKE 2 TABLETS BY MOUTH IN THE MORNING AND 1 TABLET BY MOUTH AT NIGH  3  . CALCIUM PO Take 1 tablet by mouth daily.    Marland Kitchen docusate sodium (COLACE) 100 MG capsule Take 1 capsule (100 mg total) by mouth 2 (two) times daily.  10 capsule 0  . ferrous sulfate 325 (65 FE) MG tablet Take 1 tablet (325 mg total) by mouth 3 (three) times daily after meals.  3  . hydrochlorothiazide (HYDRODIURIL) 25 MG tablet Take 25 mg by mouth daily. For Blood pressure  0  . Multiple Vitamin (MULTIVITAMIN WITH MINERALS) TABS tablet Take 1 tablet by mouth daily.    . polyethylene glycol (MIRALAX / GLYCOLAX) packet Take 17 g by mouth 2 (two) times daily. 14 each 0  . Telmisartan-Amlodipine 80-5 MG TABS Take 1 tablet by mouth daily.  0  . predniSONE (DELTASONE) 5 MG tablet Take 5 mg by mouth daily.  1  . HYDROcodone-acetaminophen (NORCO) 7.5-325 MG tablet Take 1-2 tablets by mouth every 4 (four) hours as needed for moderate pain. 100 tablet 0  . methocarbamol (ROBAXIN) 500 MG tablet Take 1 tablet (500 mg total) by mouth every 6 (six) hours as needed for muscle spasms. 40 tablet 0   No facility-administered medications prior to visit.     PAST MEDICAL HISTORY: Past Medical History:  Diagnosis Date  . Family history of adverse reaction to anesthesia    mother's BP dropped after anesthesia, and post op N/V  . Hypertension   . Lupus   . Pericarditis 2014    PAST SURGICAL HISTORY: Past Surgical History:  Procedure Laterality Date  . ESSURE TUBAL LIGATION    . FOOT SURGERY    . TOTAL HIP ARTHROPLASTY Left 02/04/2016   Procedure: LEFT TOTAL HIP ARTHROPLASTY ANTERIOR APPROACH;  Surgeon: Paralee Cancel, MD;  Location: [facility];  Service:  Orthopedics;  Laterality: Left;    FAMILY HISTORY: Family History  Problem Relation Age of Onset  . Hypertension Father   . Stroke Father   . Heart disease Father     SOCIAL HISTORY:  Social History   Social History  . Marital status: Single    Spouse name: N/A  . Number of children: 1  . Years of education: 67   Occupational History  .      [employer]   Social History Main Topics  . Smoking status: Never Smoker  . Smokeless tobacco: Never Used  . Alcohol use Yes     Comment: social  . Drug  use: No  . Sexual activity: Yes   Other Topics Concern  . Not on file   Social History Narrative   Lives with son   Caffeine - 2 x weekly     PHYSICAL EXAM  GENERAL EXAM/CONSTITUTIONAL: Vitals:  Vitals:   12/22/16 1005 12/22/16 1007  BP:  108/88  Pulse:  88  Weight: 162 lb 3.2 oz (73.6 kg)   Height: 5\' 5"  (1.651 m)      Body mass index is 26.99 kg/m.  Visual Acuity Screening   Right eye Left eye Both eyes  Without correction: 20/30 20/30   With correction:        Patient is in no distress; well developed, nourished and groomed; neck is supple  CARDIOVASCULAR:  Examination of carotid arteries is normal; no carotid bruits  Regular rate and rhythm, no murmurs  Examination of peripheral vascular system by observation and palpation is normal  EYES:  Ophthalmoscopic exam of optic discs and posterior segments is normal; no papilledema or hemorrhages  MUSCULOSKELETAL:  Gait, strength, tone, movements noted in Neurologic exam below  NEUROLOGIC: MENTAL STATUS:  No flowsheet data found.  awake, alert, oriented to person, place and time  recent and remote memory intact  normal attention and concentration  language fluent, comprehension intact, naming intact,   fund of knowledge appropriate  CRANIAL NERVE:   2nd - no papilledema on fundoscopic exam  2nd, 3rd, 4th, 6th - pupils equal and reactive to light, visual fields full to confrontation, extraocular muscles intact, no nystagmus  5th - facial sensation symmetric  7th - facial strength symmetric  8th - hearing intact  9th - palate elevates symmetrically, uvula midline  11th - shoulder shrug symmetric  12th - tongue protrusion midline  MOTOR:   normal bulk and tone, full strength in the BUE, BLE  SENSORY:   normal and symmetric to light touch, temperature, vibration  COORDINATION:   finger-nose-finger, fine finger movements normal  REFLEXES:   deep tendon reflexes present and  symmetric  GAIT/STATION:   narrow based gait    DIAGNOSTIC DATA (LABS, IMAGING, TESTING) - I reviewed patient records, labs, notes, testing and imaging myself where available.  Lab Results  Component Value Date   WBC 6.9 02/06/2016   HGB 7.6 (L) 02/06/2016   HCT 21.2 (L) 02/06/2016   MCV 82.5 02/06/2016   PLT 174 02/06/2016      Component Value Date/Time   NA 144 02/06/2016 0410   K 3.3 (L) 02/06/2016 0410   CL 111 02/06/2016 0410   CO2 26 02/06/2016 0410   GLUCOSE 157 (H) 02/06/2016 0410   BUN 17 02/06/2016 0410   CREATININE 0.75 02/06/2016 0410   CALCIUM 8.0 (L) 02/06/2016 0410   GFRNONAA >60 02/06/2016 0410   GFRAA >60 02/06/2016 0410   No results found for: CHOL, HDL,  LDLCALC, LDLDIRECT, TRIG, CHOLHDL No results found for: HGBA1C No results found for: VITAMINB12 Lab Results  Component Value Date   TSH 0.997 12/27/2013    04/15/12 CXR  - Low lung volumes with patchy bibasilar opacities.  These could reflect atelectasis and/or infiltrate. - Cardiomegaly without edema.     ASSESSMENT AND PLAN  51 y.o. year old female here with new onset low-grade daily headache for past 1 year as well as 4-5 months of intermittent right parietal sharp shooting headaches.  Ddx: occipital neuralgia + tension headache; vs secondary causes (mass, infection, inflammation, aneurysm; less likely)  1. Right-sided headache   2. Occipital neuralgia of right side   3. Tension headache   4. Family history of cerebral aneurysm     PLAN:  - check MRI brain (new onset headache; rule out infection, mass, inflammation)  - check MRA head (new onset headache; family history of brain aneurysm in mother)  - OTC ibuprofen and tylenol as needed for headaches  - may consider occipital nerve block in future  - To prevent or relieve headaches, try the following:  Cool Compress. Lie down and place a cool compress on your head.   Avoid headache triggers. If certain foods or odors seem to  have triggered your migraines in the past, avoid them. A headache diary might help you identify triggers.   Include physical activity in your daily routine.   Manage stress. Find healthy ways to cope with the stressors, such as delegating tasks on your to-do list.   Practice relaxation techniques. Try deep breathing, yoga, massage and visualization.   Eat regularly. Eating regularly scheduled meals and maintaining a healthy diet might help prevent headaches. Also, drink plenty of fluids.   Follow a regular sleep schedule. Sleep deprivation might contribute to headaches  Consider biofeedback. With this mind-body technique, you learn to control certain bodily functions - such as muscle tension, heart rate and blood pressure - to prevent headaches or reduce headache pain.   Orders Placed This Encounter  Procedures  . MR BRAIN W WO CONTRAST  . MR MRA HEAD WO CONTRAST   Return in about 2 months (around 02/19/2017).  I reviewed images, labs, notes, records myself. I summarized findings and reviewed with patient, for this high risk condition (new onset headache; immunosuppressant usage; family history of aneurysm) requiring high complexity decision making.     Penni Bombard, MD [id number], [id number] AM Certified in Neurology, Neurophysiology and Neuroimaging  [facility] [address] [place], [place] [address] [phone] a

## 2017-01-01 ENCOUNTER — Ambulatory Visit
Admission: RE | Admit: 2017-01-01 | Discharge: 2017-01-01 | Disposition: A | Discharge status: Home / Self Care | Payer: 59 | Source: Ambulatory Visit | Attending: Diagnostic Neuroimaging | Admitting: Diagnostic Neuroimaging

## 2017-01-01 DIAGNOSIS — M5481 Occipital neuralgia: Secondary | ICD-10-CM

## 2017-01-01 DIAGNOSIS — Z8249 Family history of ischemic heart disease and other diseases of the circulatory system: Secondary | ICD-10-CM

## 2017-01-01 DIAGNOSIS — R519 Headache, unspecified: Secondary | ICD-10-CM

## 2017-01-01 DIAGNOSIS — R51 Headache: Secondary | ICD-10-CM | POA: Diagnosis not present

## 2017-01-01 MED ORDER — GADOBENATE DIMEGLUMINE 529 MG/ML IV SOLN
15.0000 mL | Freq: Once | INTRAVENOUS | Status: AC | PRN
  Administered 2017-01-01: 15 mL via INTRAVENOUS

## 2017-01-07 ENCOUNTER — Telehealth: Payer: Self-pay | Admitting: *Deleted

## 2017-01-07 NOTE — Telephone Encounter (Signed)
Per Dr Leta Baptist, spoke with patient and informed her that her MRI brain and MRA head results are unremarkable. Advised he will continue with her current treatment plan. Reviewed Dr Gladstone Lighter plan with her re: headache management. Advised she monitor symptoms and call for any needs prior to her follow up in May. She verbalized understanding,a appreciation.

## 2017-03-12 ENCOUNTER — Ambulatory Visit: Payer: 59 | Admitting: Diagnostic Neuroimaging

## 2017-03-15 ENCOUNTER — Encounter: Payer: Self-pay | Admitting: Diagnostic Neuroimaging

## 2017-03-24 ENCOUNTER — Encounter: Payer: Self-pay | Admitting: Gynecology

## 2017-06-03 IMAGING — DX DG HIP (WITH OR WITHOUT PELVIS) 1V PORT*L*
2 series · 2 of 2 positions shown · non-contrast
Comparison: 12/12/2015.

CLINICAL DATA: Status post left hip arthroplasty

EXAM:
DG HIP (WITH OR WITHOUT PELVIS) 1V PORT LEFT

[hip x-table]
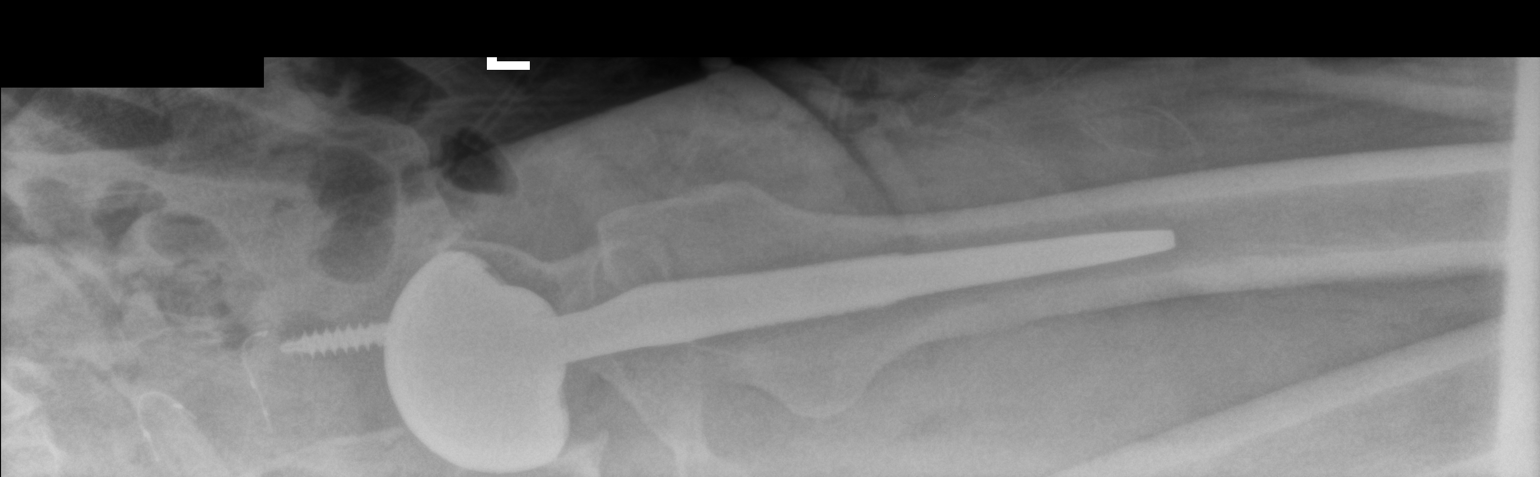

[pelvis ap]
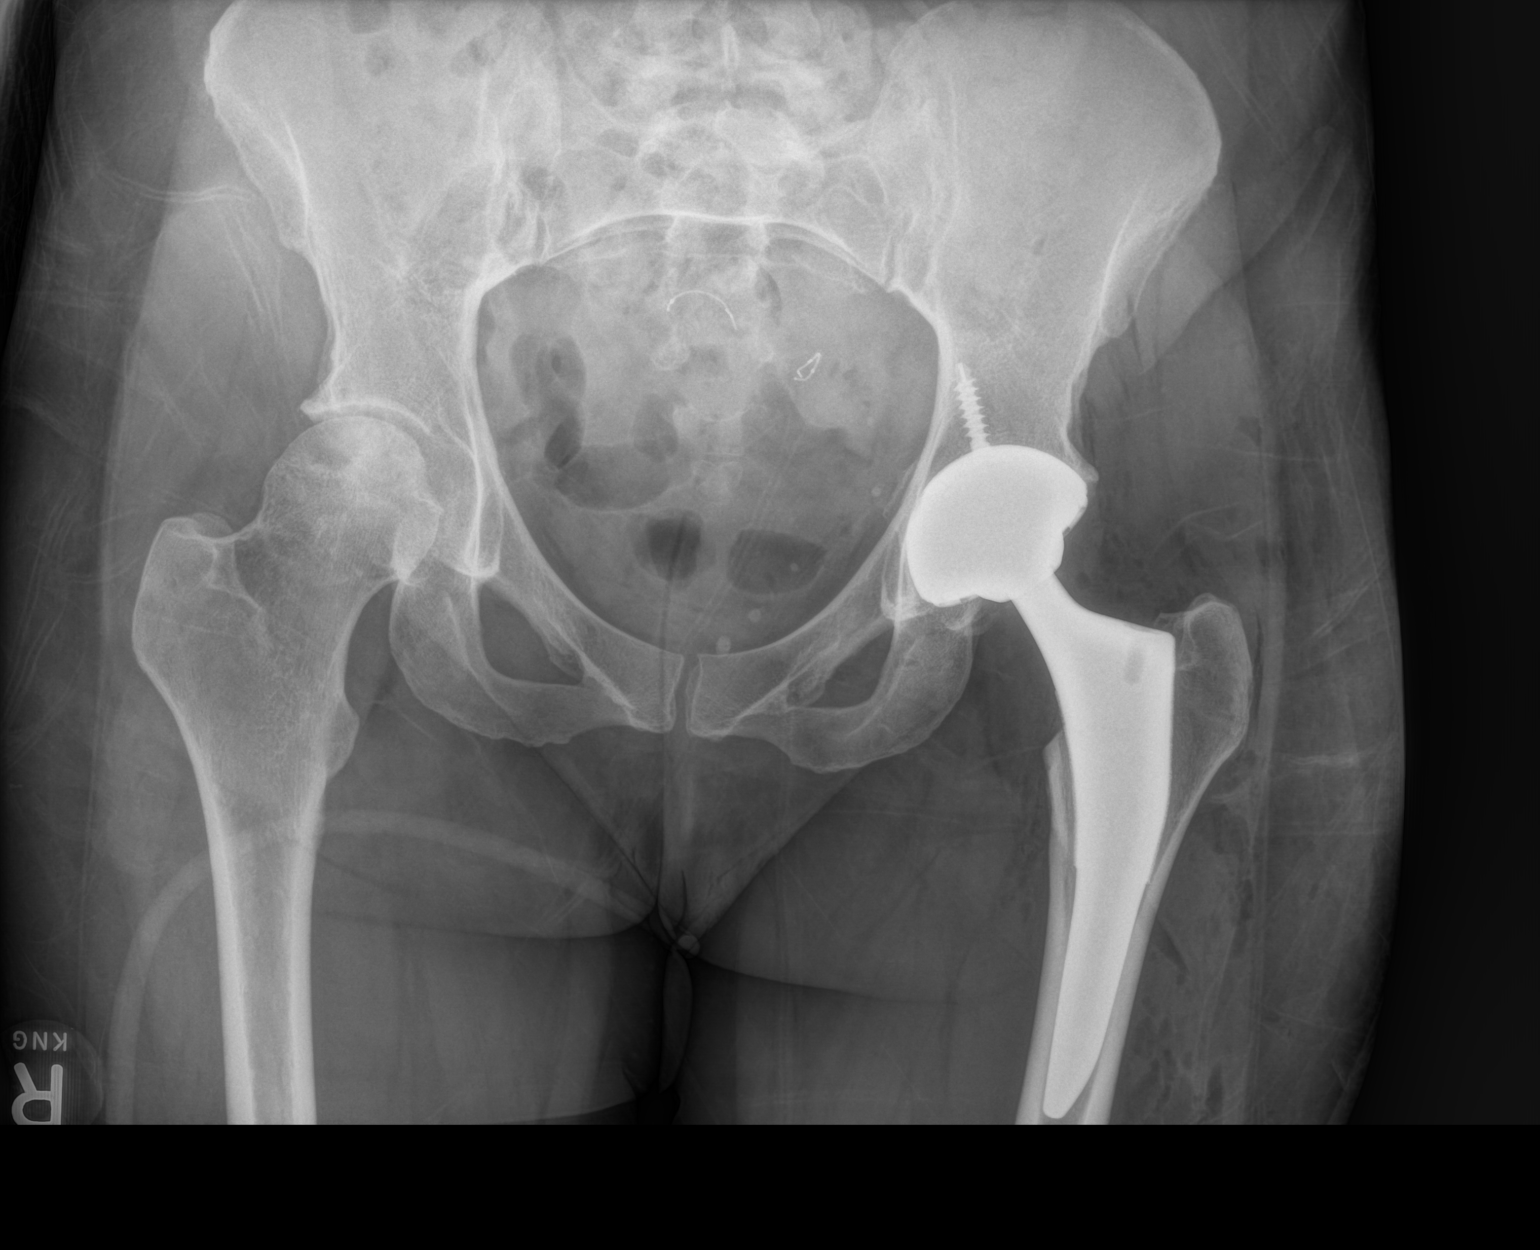

[2 of 2 positions shown; findings below may reference images not displayed]

FINDINGS: Hardware components of the left hip arthroplasty are identified. No
periprosthetic fracture or subluxation identified.
IMPRESSION: 1. No complications status post left hip arthroplasty.

## 2018-03-03 ENCOUNTER — Telehealth: Payer: Self-pay

## 2018-03-03 NOTE — Telephone Encounter (Signed)
SENT REFERRAL TO SCHEDULING 

## 2018-03-08 ENCOUNTER — Encounter: Payer: Self-pay | Admitting: Cardiology

## 2018-03-25 ENCOUNTER — Ambulatory Visit (INDEPENDENT_AMBULATORY_CARE_PROVIDER_SITE_OTHER): Payer: 59 | Admitting: Cardiology

## 2018-03-25 ENCOUNTER — Encounter: Payer: Self-pay | Admitting: Cardiology

## 2018-03-25 ENCOUNTER — Encounter (INDEPENDENT_AMBULATORY_CARE_PROVIDER_SITE_OTHER): Payer: Self-pay

## 2018-03-25 VITALS — BP 120/80 | HR 76 | Ht 65.0 in | Wt 148.2 lb

## 2018-03-25 DIAGNOSIS — I10 Essential (primary) hypertension: Secondary | ICD-10-CM

## 2018-03-25 DIAGNOSIS — Z8249 Family history of ischemic heart disease and other diseases of the circulatory system: Secondary | ICD-10-CM

## 2018-03-25 DIAGNOSIS — M329 Systemic lupus erythematosus, unspecified: Secondary | ICD-10-CM | POA: Diagnosis not present

## 2018-03-25 DIAGNOSIS — R072 Precordial pain: Secondary | ICD-10-CM | POA: Diagnosis not present

## 2018-03-25 DIAGNOSIS — IMO0002 Reserved for concepts with insufficient information to code with codable children: Secondary | ICD-10-CM

## 2018-03-25 MED ORDER — METOPROLOL TARTRATE 50 MG PO TABS: 50.0000 mg | ORAL_TABLET | Freq: Once | ORAL | 0 refills | Status: DC

## 2018-03-25 NOTE — Patient Instructions (Signed)
Medication Instructions:   Your physician recommends that you continue on your current medications as directed. Please refer to the Current Medication list given to you today.    Testing/Procedures:  CORONARY CT Please arrive at the [facility] main entrance of [facility] at xx:xx AM (30-45 minutes prior to test start time)  [facility] [address], [place] [address] [phone]  Proceed to the [facility] Radiology Department (First Floor).  Please follow these instructions carefully (unless otherwise directed):   On the Night Before the Test: . Drink plenty of water. . Do not consume any caffeinated/decaffeinated beverages or chocolate 12 hours prior to your test. . Do not take any antihistamines 12 hours prior to your test.  On the Day of the Test: . Drink plenty of water. Do not drink any water within one hour of the test. . Do not eat any food 4 hours prior to the test. . You may take your regular medications prior to the test. . IF NOT ON A BETA BLOCKER - Take 50 mg of lopressor (metoprolol) one hour before the test.   After the Test: . Drink plenty of water. . After receiving IV contrast, you may experience a mild flushed feeling. This is normal. . On occasion, you may experience a mild rash up to 24 hours after the test. This is not dangerous. If this occurs, you can take Benadryl 25 mg and increase your fluid intake. . If you experience trouble breathing, this can be serious. If it is severe call 911 IMMEDIATELY. If it is mild, please call our office.     Follow-Up:  4 MONTHS WITH DR Meda Coffee       If you need a refill on your cardiac medications before your next appointment, please call your pharmacy.

## 2018-03-25 NOTE — Progress Notes (Signed)
Cardiology Office Note:    Date:  03/26/2018   ID:  Sandra Stevenson, DOB 11-30-1965, MRN [id number]  PCP:  Sandra Lei, MD  Cardiologist:  Sandra Dawley, MD  Referring MD: Sandra Lei, MD   Chief complaint: Dyspnea on exertion  History of Present Illness:    Sandra Stevenson is a 52 y.o. female with a hx of systemic lupus erythematosus.  Hypertension, anemia, family history of premature coronary artery disease, who is coming for concern of dyspnea on exertion.  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  This is associated with retrosternal pressure.  No palpitation dizziness or syncope.  No claudications no lower extremity edema.  She has a personal history of pericarditis several years ago that she has recovered from.  Her lipids are being followed by her primary care physician and were always within normal limits.  She has never smoked.  Her family history CAD and CABG and her mother at age of 23 and stroke in her father age of 47.  Past Medical History:  Diagnosis Date  . Family history of adverse reaction to anesthesia    mother's BP dropped after anesthesia, and post op N/V  . Hypertension   . Lupus ([place])   . Pericarditis 2014    Past Surgical History:  Procedure Laterality Date  . ESSURE TUBAL LIGATION    . FOOT SURGERY    . TOTAL HIP ARTHROPLASTY Left 02/04/2016   Procedure: LEFT TOTAL HIP ARTHROPLASTY ANTERIOR APPROACH;  Surgeon: Paralee Cancel, MD;  Location: [facility];  Service: Orthopedics;  Laterality: Left;   Current Medications: Current Meds  Medication Sig  . azaTHIOprine (IMURAN) 50 MG tablet Take 50-100 mg by mouth 2 (two) times daily. TAKE 2 TABLETS BY MOUTH IN THE MORNING AND 1 TABLET BY MOUTH AT [facility]  . hydrochlorothiazide (HYDRODIURIL) 25 MG tablet Take 25 mg by mouth daily. For Blood pressure  . ibuprofen (ADVIL,MOTRIN)  800 MG tablet 800 mg as needed.  . Multiple Vitamin (MULTIVITAMIN WITH MINERALS) TABS tablet Take 1 tablet by mouth daily.  . polyethylene glycol (MIRALAX / GLYCOLAX) packet Take 17 g by mouth 2 (two) times daily.  . Telmisartan-Amlodipine 80-5 MG TABS Take 1 tablet by mouth daily.     Allergies:   Plaquenil [hydroxychloroquine sulfate]   Social History   Socioeconomic History  . Marital status: Single    Spouse name: Not on file  . Number of children: 1  . Years of education: 29  . Highest education level: Not on file  Occupational History    Comment: [employer]  Social Needs  . Financial resource strain: Not on file  . Food insecurity:    Worry: Not on file    Inability: Not on file  . Transportation needs:    Medical: Not on file    Non-medical: Not on file  Tobacco Use  . Smoking status: Never Smoker  . Smokeless tobacco: Never Used  Substance and Sexual Activity  . Alcohol use: Yes    Comment: social  . Drug use: No  . Sexual activity: Yes  Lifestyle  . Physical activity:    Days per week: Not on file    Minutes per session: Not on file  . Stress: Not on file  Relationships  . Social connections:    Talks on phone: Not on file  Gets together: Not on file    Attends religious service: Not on file    Active member of club or organization: Not on file    Attends meetings of clubs or organizations: Not on file    Relationship status: Not on file  Other Topics Concern  . Not on file  Social History Narrative   Lives with son   Caffeine - 2 x weekly     Family History: The patient's family history includes Heart disease in her father; Hypertension in her father and mother; Stroke in her father.  ROS:   Please see the history of present illness.    All other systems reviewed and are negative.  EKGs/Labs/Other Studies Reviewed:    The following studies were reviewed today:  EKG:  EKG is ordered today.  The ekg ordered today demonstrates normal sinus rhythm,  nonspecific ST-T wave abnormality.  When compared to prior EKG from April 21, 2012 is unchanged.  This was personally reviewed.  Recent Labs: No results found for requested labs within last 8760 hours.  Recent Lipid Panel No results found for: CHOL, TRIG, HDL, CHOLHDL, VLDL, LDLCALC, LDLDIRECT  Physical Exam:    VS:  BP 120/80   Pulse 76   Ht 5\' 5"  (1.651 m)   Wt 148 lb 3.2 oz (67.2 kg)   SpO2 98%   BMI 24.66 kg/m     Wt Readings from Last 3 Encounters:  03/25/18 148 lb 3.2 oz (67.2 kg)  12/22/16 162 lb 3.2 oz (73.6 kg)  02/04/16 154 lb (69.9 kg)     GEN: Well nourished, well developed in no acute distress HEENT: Normal NECK: No JVD; No carotid bruits LYMPHATICS: No lymphadenopathy CARDIAC: RRR, no murmurs, rubs, gallops RESPIRATORY:  Clear to auscultation without rales, wheezing or rhonchi  ABDOMEN: Soft, non-tender, non-distended MUSCULOSKELETAL:  No edema; No deformity  SKIN: Warm and dry NEUROLOGIC:  Alert and oriented x 3 PSYCHIATRIC:  Normal affect   ASSESSMENT:    1. Precordial pain   2. Family history of early CAD   3. Essential hypertension   4. Lupus ([place])    PLAN:    In order of problems listed above:  1. Dyspnea on exertion, retrosternal chest pain on exertion.  Risk factors include systemic lupus erythematosus, hypertension and family history of premature coronary artery disease.  We will obtain coronary CTA to evaluate for coronary artery disease.  If normal or nonobstructive CAD we will obtain pulmonary function test. 2. Hypertension -controlled, followed by primary care physician. 3. Lipids -at goal.   Medication Adjustments/Labs and Tests Ordered: Current medicines are reviewed at length with the patient today.  Concerns regarding medicines are outlined above.  Orders Placed This Encounter  Procedures  . CT CORONARY MORPH W/CTA COR W/SCORE W/CA W/CM &/OR WO/CM  . CT CORONARY FRACTIONAL FLOW RESERVE DATA PREP  . CT CORONARY FRACTIONAL FLOW  RESERVE FLUID ANALYSIS  . EKG 12-Lead   Meds ordered this encounter  Medications  . metoprolol tartrate (LOPRESSOR) 50 MG tablet    Sig: Take 1 tablet (50 mg total) by mouth once for 1 dose. Take 1 hour prior to your Coronary CT.    Dispense:  1 tablet    Refill:  0    Patient Instructions  Medication Instructions:   Your physician recommends that you continue on your current medications as directed. Please refer to the Current Medication list given to you today.    Testing/Procedures:  CORONARY CT Please arrive at the  [facility] main entrance of [facility] at xx:xx AM (30-45 minutes prior to test start time)  [facility] [place], [place] [address] [phone]  Proceed to the [facility] Radiology Department (First Floor).  Please follow these instructions carefully (unless otherwise directed):   On the Night Before the Test: . Drink plenty of water. . Do not consume any caffeinated/decaffeinated beverages or chocolate 12 hours prior to your test. . Do not take any antihistamines 12 hours prior to your test.  On the Day of the Test: . Drink plenty of water. Do not drink any water within one hour of the test. . Do not eat any food 4 hours prior to the test. . You may take your regular medications prior to the test. . IF NOT ON A BETA BLOCKER - Take 50 mg of lopressor (metoprolol) one hour before the test.   After the Test: . Drink plenty of water. . After receiving IV contrast, you may experience a mild flushed feeling. This is normal. . On occasion, you may experience a mild rash up to 24 hours after the test. This is not dangerous. If this occurs, you can take Benadryl 25 mg and increase your fluid intake. . If you experience trouble breathing, this can be serious. If it is severe call 911 IMMEDIATELY. If it is mild, please call our office.     Follow-Up:  4 MONTHS WITH DR Meda Coffee       If you need a refill on your  cardiac medications before your next appointment, please call your pharmacy.      Signed, Sandra Dawley, MD  03/26/2018 8:45 PM    [facility]

## 2018-05-02 ENCOUNTER — Ambulatory Visit (HOSPITAL_COMMUNITY): Payer: 59

## 2018-05-11 ENCOUNTER — Ambulatory Visit (HOSPITAL_COMMUNITY)
Admission: RE | Admit: 2018-05-11 | Discharge: 2018-05-11 | Disposition: A | Discharge status: Home / Self Care | Payer: 59 | Source: Ambulatory Visit | Attending: Cardiology | Admitting: Cardiology

## 2018-05-11 ENCOUNTER — Encounter (HOSPITAL_COMMUNITY): Payer: Self-pay

## 2018-05-11 DIAGNOSIS — Z8249 Family history of ischemic heart disease and other diseases of the circulatory system: Secondary | ICD-10-CM

## 2018-05-11 DIAGNOSIS — M329 Systemic lupus erythematosus, unspecified: Secondary | ICD-10-CM | POA: Insufficient documentation

## 2018-05-11 DIAGNOSIS — I10 Essential (primary) hypertension: Secondary | ICD-10-CM | POA: Diagnosis present

## 2018-05-11 DIAGNOSIS — R0609 Other forms of dyspnea: Secondary | ICD-10-CM | POA: Diagnosis not present

## 2018-05-11 DIAGNOSIS — I700 Atherosclerosis of aorta: Secondary | ICD-10-CM | POA: Insufficient documentation

## 2018-05-11 DIAGNOSIS — R072 Precordial pain: Secondary | ICD-10-CM

## 2018-05-11 DIAGNOSIS — IMO0002 Reserved for concepts with insufficient information to code with codable children: Secondary | ICD-10-CM

## 2018-05-11 MED ORDER — IOPAMIDOL (ISOVUE-370) INJECTION 76%
INTRAVENOUS | Status: AC
  Filled 2018-05-11: qty 100

## 2018-05-11 MED ORDER — NITROGLYCERIN 0.4 MG SL SUBL
SUBLINGUAL_TABLET | SUBLINGUAL | Status: AC
  Administered 2018-05-11: 0.8 mg via SUBLINGUAL
  Filled 2018-05-11: qty 1

## 2018-05-11 MED ORDER — IOPAMIDOL (ISOVUE-370) INJECTION 76%
100.0000 mL | Freq: Once | INTRAVENOUS | Status: AC | PRN
  Administered 2018-05-11: 100 mL via INTRAVENOUS

## 2018-05-11 MED ORDER — NITROGLYCERIN 0.4 MG SL SUBL
0.8000 mg | SUBLINGUAL_TABLET | Freq: Once | SUBLINGUAL | Status: AC
Start: 2018-05-11 — End: 2018-05-11
  Administered 2018-05-11: 0.8 mg via SUBLINGUAL
  Filled 2018-05-11: qty 25

## 2018-05-11 MED ORDER — METOPROLOL TARTRATE 5 MG/5ML IV SOLN
INTRAVENOUS | Status: AC
  Filled 2018-05-11: qty 5

## 2018-05-11 MED ORDER — METOPROLOL TARTRATE 5 MG/5ML IV SOLN
5.0000 mg | Freq: Once | INTRAVENOUS | Status: AC
  Administered 2018-05-11: 5 mg via INTRAVENOUS
  Filled 2018-05-11: qty 5

## 2018-05-17 ENCOUNTER — Telehealth: Payer: Self-pay | Admitting: Cardiology

## 2018-05-17 NOTE — Telephone Encounter (Signed)
Follow Up:      Returning your call from today. 

## 2018-05-17 NOTE — Telephone Encounter (Signed)
Notified the pt that per Dr Meda Coffee, she has no coronary artery disease, and had a normal cardiac CT.  Pt verbalized understanding.

## 2020-03-05 ENCOUNTER — Encounter: Payer: Self-pay | Admitting: Cardiology

## 2020-03-05 ENCOUNTER — Ambulatory Visit (INDEPENDENT_AMBULATORY_CARE_PROVIDER_SITE_OTHER): Payer: 59 | Admitting: Cardiology

## 2020-03-05 ENCOUNTER — Other Ambulatory Visit: Payer: Self-pay

## 2020-03-05 VITALS — BP 124/72 | HR 108 | Ht 64.0 in | Wt 166.8 lb

## 2020-03-05 DIAGNOSIS — R0600 Dyspnea, unspecified: Secondary | ICD-10-CM | POA: Diagnosis not present

## 2020-03-05 DIAGNOSIS — I10 Essential (primary) hypertension: Secondary | ICD-10-CM | POA: Diagnosis not present

## 2020-03-05 DIAGNOSIS — R072 Precordial pain: Secondary | ICD-10-CM

## 2020-03-05 DIAGNOSIS — Z8249 Family history of ischemic heart disease and other diseases of the circulatory system: Secondary | ICD-10-CM

## 2020-03-05 DIAGNOSIS — M329 Systemic lupus erythematosus, unspecified: Secondary | ICD-10-CM | POA: Diagnosis not present

## 2020-03-05 DIAGNOSIS — R0609 Other forms of dyspnea: Secondary | ICD-10-CM

## 2020-03-05 NOTE — Patient Instructions (Signed)
Medication Instructions:   Your physician recommends that you continue on your current medications as directed. Please refer to the Current Medication list given to you today.  *If you need a refill on your cardiac medications before your next appointment, please call your pharmacy*   Testing/Procedures:  Your physician has requested that you have an echocardiogram. Echocardiography is a painless test that uses sound waves to create images of your heart. It provides your doctor with information about the size and shape of your heart and how well your heart's chambers and valves are working. This procedure takes approximately one hour. There are no restrictions for this procedure.  Your physician has recommended that you have a pulmonary function test. Pulmonary Function Tests are a group of tests that measure how well air moves in and out of your lungs.   Follow-Up:  SOMETIME IN June 2021 WITH DR. Debby Bud PLEASE ADD ANYWHERE IN [place]

## 2020-03-05 NOTE — Progress Notes (Signed)
Cardiology Office Note:    Date:  03/05/2020   ID:  Sandra Stevenson, DOB 12/09/1965, MRN [id number]  PCP:  Lucianne Lei, MD  Cardiologist:  Ena Dawley, MD  Referring MD: Lucianne Lei, MD   Chief complaint: Dyspnea on exertion, 2-year follow-up  History of Present Illness:    Sandra Stevenson is a 54 y.o. female with a hx of systemic lupus erythematosus.  Hypertension, anemia, family history of premature coronary artery disease, who is coming for concern of dyspnea on exertion.  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  This is associated with retrosternal pressure.  No palpitation dizziness or syncope.  No claudications no lower extremity edema.  She has a personal history of pericarditis several years ago that she has recovered from.  Her lipids are being followed by her primary care physician and were always within normal limits.  She has never smoked.  Her family history CAD and CABG and her mother at age of 62 and stroke in her father age of 99.  03/05/2020 -the patient is coming after 2 years, after the last visit she underwent coronary CTA where calcium score was 0 and she had no evidence for coronary artery disease.  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  She gets short of breath with walking 1 flight of stairs.  Few weeks ago she developed " pleurisy " with retrosternal chest pain with deep inspiration, she saw her PCP and her symptoms have resolved with Z-Pak.  There is also associated nasal discharge that was yellowish.  This is currently resolved.  But her dyspnea on exertion persist.  She has no palpitation dizziness or syncope.  Past Medical History:  Diagnosis Date  . Family history of adverse reaction to  anesthesia    mother's BP dropped after anesthesia, and post op N/V  . Hypertension   . Lupus ([place])   . Pericarditis 2014   Past Surgical History:  Procedure Laterality Date  . ESSURE TUBAL LIGATION    . FOOT SURGERY    . TOTAL HIP ARTHROPLASTY Left 02/04/2016   Procedure: LEFT TOTAL HIP ARTHROPLASTY ANTERIOR APPROACH;  Surgeon: Paralee Cancel, MD;  Location: [facility];  Service: Orthopedics;  Laterality: Left;   Current Medications: Current Meds  Medication Sig  . azaTHIOprine (IMURAN) 50 MG tablet Take 50-100 mg by mouth 2 (two) times daily. TAKE 2 TABLETS BY MOUTH IN THE MORNING AND 1 TABLET BY MOUTH AT [facility]  . hydrochlorothiazide (HYDRODIURIL) 25 MG tablet Take 25 mg by mouth daily. For Blood pressure  . ibuprofen (ADVIL,MOTRIN) 800 MG tablet 800 mg as needed.  . Multiple Vitamin (MULTIVITAMIN WITH MINERALS) TABS tablet Take 1 tablet by mouth. 3-4 days a week  . predniSONE (DELTASONE) 5 MG tablet Take 5 mg by mouth daily with breakfast.    Allergies:   Plaquenil [hydroxychloroquine sulfate]   Social History   Socioeconomic History  . Marital status: Single    Spouse name: Not on file  . Number of children: 1  . Years of education: 35  . Highest education level: Not on file  Occupational History    Comment: [employer]  Tobacco Use  . Smoking status: Never Smoker  . Smokeless tobacco: Never  Used  Substance and Sexual Activity  . Alcohol use: Yes    Comment: social  . Drug use: No  . Sexual activity: Yes  Other Topics Concern  . Not on file  Social History Narrative   Lives with son   Caffeine - 2 x weekly   Social Determinants of Health   Financial Resource Strain:   . Difficulty of Paying Living Expenses:   Food Insecurity:   . Worried About Charity fundraiser in the Last Year:   . Arboriculturist in the Last Year:   Transportation Needs:   . Film/video editor (Medical):   Marland Kitchen Lack of Transportation (Non-Medical):   Physical Activity:   . Days of Exercise per  Week:   . Minutes of Exercise per Session:   Stress:   . Feeling of Stress :   Social Connections:   . Frequency of Communication with Friends and Family:   . Frequency of Social Gatherings with Friends and Family:   . Attends Religious Services:   . Active Member of Clubs or Organizations:   . Attends Archivist Meetings:   Marland Kitchen Marital Status:     Family History: The patient's family history includes Heart disease in her father; Hypertension in her father and mother; Stroke in her father.  ROS:   Please see the history of present illness.    All other systems reviewed and are negative.  EKGs/Labs/Other Studies Reviewed:    The following studies were reviewed today:  EKG:  EKG is ordered today.  The ekg ordered today demonstrates normal sinus rhythm, nonspecific ST-T wave abnormality.  When compared to prior EKG from 2019 there are no changes.  This was personally reviewed.  Recent Labs: No results found for requested labs within last 8760 hours.  Recent Lipid Panel No results found for: CHOL, TRIG, HDL, CHOLHDL, VLDL, LDLCALC, LDLDIRECT  Physical Exam:    VS:  BP 124/72   Pulse (!) 108   Ht 5\' 4"  (1.626 m)   Wt 166 lb 12.8 oz (75.7 kg)   SpO2 98%   BMI 28.63 kg/m     Wt Readings from Last 3 Encounters:  03/05/20 166 lb 12.8 oz (75.7 kg)  03/25/18 148 lb 3.2 oz (67.2 kg)  12/22/16 162 lb 3.2 oz (73.6 kg)    GEN: Well nourished, well developed in no acute distress HEENT: Normal NECK: No JVD; No carotid bruits LYMPHATICS: No lymphadenopathy CARDIAC: RRR, no murmurs, rubs, gallops RESPIRATORY:  Clear to auscultation without rales, wheezing or rhonchi  ABDOMEN: Soft, non-tender, non-distended MUSCULOSKELETAL:  No edema; No deformity  SKIN: Warm and dry NEUROLOGIC:  Alert and oriented x 3 PSYCHIATRIC:  Normal affect   CCTA: 05/11/2018  IMPRESSION: 1. Coronary calcium score of 0. This was 0 percentile for age and sex matched control. 2. Normal coronary  origin with right dominance. 3. No evidence of CAD.   ASSESSMENT:    1. DOE (dyspnea on exertion)   2. Essential hypertension   3. Family history of early CAD   75. Lupus ([place])   5. Precordial pain    PLAN:    In order of problems listed above:  1. Dyspnea on exertion, retrosternal chest pain on exertion.  Risk factors include systemic lupus erythematosus, hypertension and family history of premature coronary artery disease.  We previously obtain coronary CTA that showed no evidence for coronary artery disease.  We will obtain an echocardiogram and pulmonary function test. 2. Hypertension -controlled,  followed by primary care physician. 3. Lipids -followed by her primary care physician, most recent HDL 56, LDL 115 and triglycerides 48.  We will continue diet and exercise only as she has no evidence for coronary artery disease.   Medication Adjustments/Labs and Tests Ordered: Current medicines are reviewed at length with the patient today.  Concerns regarding medicines are outlined above.  Orders Placed This Encounter  Procedures  . EKG 12-Lead  . ECHOCARDIOGRAM COMPLETE  . Pulmonary function test   No orders of the defined types were placed in this encounter.   Patient Instructions  Medication Instructions:   Your physician recommends that you continue on your current medications as directed. Please refer to the Current Medication list given to you today.  *If you need a refill on your cardiac medications before your next appointment, please call your pharmacy*   Testing/Procedures:  Your physician has requested that you have an echocardiogram. Echocardiography is a painless test that uses sound waves to create images of your heart. It provides your doctor with information about the size and shape of your heart and how well your heart's chambers and valves are working. This procedure takes approximately one hour. There are no restrictions for this procedure.  Your physician  has recommended that you have a pulmonary function test. Pulmonary Function Tests are a group of tests that measure how well air moves in and out of your lungs.   Follow-Up:  SOMETIME IN June 2021 WITH DR. Vonda Antigua ADD ANYWHERE IN [place]       Signed, Ena Dawley, MD  03/05/2020 8:48 AM    [facility]

## 2020-03-22 ENCOUNTER — Other Ambulatory Visit: Payer: Self-pay

## 2020-03-22 ENCOUNTER — Ambulatory Visit (HOSPITAL_COMMUNITY): Payer: 59 | Attending: Cardiology

## 2020-03-22 DIAGNOSIS — R0600 Dyspnea, unspecified: Secondary | ICD-10-CM | POA: Insufficient documentation

## 2020-03-22 DIAGNOSIS — R0609 Other forms of dyspnea: Secondary | ICD-10-CM

## 2020-03-25 ENCOUNTER — Telehealth: Payer: Self-pay | Admitting: Cardiology

## 2020-03-25 NOTE — Telephone Encounter (Signed)
   Pt is returning call from [facility] regarding echo results

## 2020-03-25 NOTE — Telephone Encounter (Signed)
Sandra Spark, MD  Nuala Alpha, LPN  Normal systolic and diastolic function, no significant valvular abnormalities    The patient has been notified of the result and verbalized understanding.  All questions (if any) were answered. Nuala Alpha, LPN [phone] [id number] PM

## 2020-04-27 ENCOUNTER — Other Ambulatory Visit (HOSPITAL_COMMUNITY): Payer: 59

## 2020-04-29 ENCOUNTER — Other Ambulatory Visit: Payer: 59 | Attending: Cardiology

## 2020-05-01 ENCOUNTER — Ambulatory Visit (INDEPENDENT_AMBULATORY_CARE_PROVIDER_SITE_OTHER): Payer: 59 | Admitting: Internal Medicine

## 2020-05-01 ENCOUNTER — Encounter: Payer: Self-pay | Admitting: Cardiology

## 2020-05-01 ENCOUNTER — Ambulatory Visit (INDEPENDENT_AMBULATORY_CARE_PROVIDER_SITE_OTHER): Payer: 59 | Admitting: Cardiology

## 2020-05-01 ENCOUNTER — Other Ambulatory Visit: Payer: Self-pay

## 2020-05-01 VITALS — BP 124/82 | HR 93 | Ht 64.0 in | Wt 165.8 lb

## 2020-05-01 DIAGNOSIS — R0609 Other forms of dyspnea: Secondary | ICD-10-CM

## 2020-05-01 DIAGNOSIS — M329 Systemic lupus erythematosus, unspecified: Secondary | ICD-10-CM

## 2020-05-01 DIAGNOSIS — G629 Polyneuropathy, unspecified: Secondary | ICD-10-CM

## 2020-05-01 DIAGNOSIS — R0600 Dyspnea, unspecified: Secondary | ICD-10-CM | POA: Diagnosis not present

## 2020-05-01 LAB — PULMONARY FUNCTION TEST
DL/VA % pred: 84 %
DL/VA: 3.61 ml/min/mmHg/L
DLCO cor % pred: 69 %
DLCO cor: 14.84 ml/min/mmHg
DLCO unc % pred: 69 %
DLCO unc: 14.84 ml/min/mmHg
FEF 25-75 Post: 3.2 L/sec
FEF 25-75 Pre: 2.54 L/sec
FEF2575-%Change-Post: 26 %
FEF2575-%Pred-Post: 130 %
FEF2575-%Pred-Pre: 103 %
FEV1-%Change-Post: 4 %
FEV1-%Pred-Post: 99 %
FEV1-%Pred-Pre: 95 %
FEV1-Post: 2.33 L
FEV1-Pre: 2.23 L
FEV1FVC-%Change-Post: 4 %
FEV1FVC-%Pred-Pre: 102 %
FEV6-%Change-Post: 0 %
FEV6-%Pred-Post: 93 %
FEV6-%Pred-Pre: 93 %
FEV6-Post: 2.67 L
FEV6-Pre: 2.67 L
FEV6FVC-%Pred-Post: 102 %
FEV6FVC-%Pred-Pre: 102 %
FVC-%Change-Post: 0 %
FVC-%Pred-Post: 91 %
FVC-%Pred-Pre: 91 %
FVC-Post: 2.67 L
FVC-Pre: 2.67 L
Post FEV1/FVC ratio: 87 %
Post FEV6/FVC ratio: 100 %
Pre FEV1/FVC ratio: 83 %
Pre FEV6/FVC Ratio: 100 %
RV % pred: 99 %
RV: 1.82 L
TLC % pred: 88 %
TLC: 4.52 L

## 2020-05-01 NOTE — Progress Notes (Signed)
Cardiology Office Note:    Date:  05/01/2020   ID:  Sandra Stevenson, DOB 07/28/1966, MRN [id number]  PCP:  Lucianne Lei, MD  Cardiologist:  Ena Dawley, MD  Referring MD: Lucianne Lei, MD   Chief complaint: Dyspnea on exertion, left lower extremity numbness  History of Present Illness:    Sandra Stevenson is a 54 y.o. female with a hx of systemic lupus erythematosus.  Hypertension, anemia, family history of premature coronary artery disease, who is coming for concern of dyspnea on exertion.  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  This is associated with retrosternal pressure.  No palpitation dizziness or syncope.  No claudications no lower extremity edema.  She has a personal history of pericarditis several years ago that she has recovered from.  Her lipids are being followed by her primary care physician and were always within normal limits.  She has never smoked.  Her family history CAD and CABG and her mother at age of 3 and stroke in her father age of 40.  03/05/2020 -the patient is coming after 2 years, after the last visit she underwent coronary CTA where calcium score was 0 and she had no evidence for coronary artery disease.  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  She gets short of breath with walking 1 flight of stairs.  Few weeks ago she developed " pleurisy " with retrosternal chest pain with deep inspiration, she saw her PCP and her symptoms have resolved with Z-Pak.  There is also associated nasal discharge that was yellowish.  This is currently resolved.  But her dyspnea on exertion persist.  She has no palpitation dizziness or syncope.  05/01/2020 the patient is coming after 2 months, she states that she  continues to feel short of breath on exertion, her chest pain has resolved.  She states that previously she used to compete with her friends with number of steps per day and she used to go to gym on a regular basis however she stopped that in the last couple of years.  She has occasional sudden onset numbness in her left lower extremity with tingling but no weakness.  No difficulties walking no falls.  This happens approximately every other week, she is post left hip replacement.  Past Medical History:  Diagnosis Date  . Family history of adverse reaction to anesthesia    mother's BP dropped after anesthesia, and post op N/V  . Hypertension   . Lupus ([place])   . Pericarditis 2014   Past Surgical History:  Procedure Laterality Date  . ESSURE TUBAL LIGATION    . FOOT SURGERY    . TOTAL HIP ARTHROPLASTY Left 02/04/2016   Procedure: LEFT TOTAL HIP ARTHROPLASTY ANTERIOR APPROACH;  Surgeon: Paralee Cancel, MD;  Location: [facility];  Service: Orthopedics;  Laterality: Left;   Current Medications: Current Meds  Medication Sig  . azaTHIOprine (IMURAN) 50 MG tablet Take 50-100 mg by mouth 2 (two) times daily. TAKE 2 TABLETS BY MOUTH IN THE MORNING AND 1 TABLET BY MOUTH AT [facility]  . hydrochlorothiazide (HYDRODIURIL) 25 MG tablet Take 25 mg by mouth daily. For Blood pressure  . ibuprofen (ADVIL,MOTRIN) 800 MG tablet 800 mg as needed.  Marland Kitchen  Multiple Vitamin (MULTIVITAMIN WITH MINERALS) TABS tablet Take 1 tablet by mouth. 3-4 days a week  . predniSONE (DELTASONE) 5 MG tablet Take 5 mg by mouth daily with breakfast.  . Telmisartan-Amlodipine 80-5 MG TABS Take 1 tablet by mouth daily.    Allergies:   Plaquenil [hydroxychloroquine sulfate]   Social History   Socioeconomic History  . Marital status: Single    Spouse name: Not on file  . Number of children: 1  . Years of education: 5  . Highest education level: Not on file  Occupational History    Comment: [employer]  Tobacco Use  . Smoking status: Never Smoker    . Smokeless tobacco: Never Used  Vaping Use  . Vaping Use: Never used  Substance and Sexual Activity  . Alcohol use: Yes    Comment: social  . Drug use: No  . Sexual activity: Yes  Other Topics Concern  . Not on file  Social History Narrative   Lives with son   Caffeine - 2 x weekly   Social Determinants of Health   Financial Resource Strain:   . Difficulty of Paying Living Expenses:   Food Insecurity:   . Worried About Charity fundraiser in the Last Year:   . Arboriculturist in the Last Year:   Transportation Needs:   . Film/video editor (Medical):   Marland Kitchen Lack of Transportation (Non-Medical):   Physical Activity:   . Days of Exercise per Week:   . Minutes of Exercise per Session:   Stress:   . Feeling of Stress :   Social Connections:   . Frequency of Communication with Friends and Family:   . Frequency of Social Gatherings with Friends and Family:   . Attends Religious Services:   . Active Member of Clubs or Organizations:   . Attends Archivist Meetings:   Marland Kitchen Marital Status:     Family History: The patient's family history includes Heart disease in her father; Hypertension in her father and mother; Stroke in her father.  ROS:   Please see the history of present illness.    All other systems reviewed and are negative.  EKGs/Labs/Other Studies Reviewed:    The following studies were reviewed today:  EKG:  EKG is ordered today.  The ekg ordered today demonstrates normal sinus rhythm, nonspecific ST-T wave abnormality.  When compared to prior EKG from 2019 there are no changes.  This was personally reviewed.  Recent Labs: No results found for requested labs within last 8760 hours.  Recent Lipid Panel No results found for: CHOL, TRIG, HDL, CHOLHDL, VLDL, LDLCALC, LDLDIRECT  Physical Exam:    VS:  BP 124/82   Pulse 93   Ht 5\' 4"  (1.626 m)   Wt 165 lb 12.8 oz (75.2 kg)   SpO2 95%   BMI 28.46 kg/m     Wt Readings from Last 3 Encounters:   05/01/20 165 lb 12.8 oz (75.2 kg)  03/05/20 166 lb 12.8 oz (75.7 kg)  03/25/18 148 lb 3.2 oz (67.2 kg)    GEN: Well nourished, well developed in no acute distress HEENT: Normal NECK: No JVD; No carotid bruits LYMPHATICS: No lymphadenopathy CARDIAC: RRR, no murmurs, rubs, gallops RESPIRATORY:  Clear to auscultation without rales, wheezing or rhonchi  ABDOMEN: Soft, non-tender, non-distended MUSCULOSKELETAL:  No edema; No deformity  SKIN: Warm and dry NEUROLOGIC:  Alert and oriented x 3 PSYCHIATRIC:  Normal affect   CCTA: 05/11/2018  IMPRESSION: 1. Coronary calcium score  of 0. This was 0 percentile for age and sex matched control. 2. Normal coronary origin with right dominance. 3. No evidence of CAD.   ASSESSMENT:    1. Neuropathy   2. Lupus ([place])    PLAN:    In order of problems listed above:  1. Dyspnea on exertion -the patient has completely normal echocardiogram and no CAD on coronary CT in 2019.  She is scheduled to undergo pulmonary function test this Friday.  Her risk factors include systemic lupus erythematosus, hypertension and family history of premature coronary artery disease.  She is advised to restart walking or exercising for at least 30 minutes 5 days a week. 2. LLE neuropathy might be related to prior hip surgery, we will refer to neurology.  I have checked side effects of Imuran and is not listed among possible side effects. 3. Hypertension -controlled, followed by primary care physician. 4. Lipids -followed by her primary care physician, most recent HDL 56, LDL 115 and triglycerides 48.  We will continue diet and exercise only as she has no evidence for coronary artery disease.   Medication Adjustments/Labs and Tests Ordered: Current medicines are reviewed at length with the patient today.  Concerns regarding medicines are outlined above.  Orders Placed This Encounter  Procedures  . Ambulatory referral to Neurology   No orders of the defined types were  placed in this encounter.   Patient Instructions  Medication Instructions:   Your physician recommends that you continue on your current medications as directed. Please refer to the Current Medication list given to you today.  *If you need a refill on your cardiac medications before your next appointment, please call your pharmacy*   Referral :  You have been referred to TO [place]: At [facility], you and your health needs are our priority.  As part of our continuing mission to provide you with exceptional heart care, we have created designated Provider Care Teams.  These Care Teams include your primary Cardiologist (physician) and Advanced Practice Providers (APPs -  Physician Assistants and Nurse Practitioners) who all work together to provide you with the care you need, when you need it.  We recommend signing up for the patient portal called "MyChart".  Sign up information is provided on this After Visit Summary.  MyChart is used to connect with patients for Virtual Visits (Telemedicine).  Patients are able to view lab/test results, encounter notes, upcoming appointments, etc.  Non-urgent messages can be sent to your provider as well.   To learn more about what you can do with MyChart, go to [email or web address].    Your next appointment:   12 month(s)  The format for your next appointment:   In Person  Provider:   Ena Dawley, MD        Signed, Ena Dawley, MD  05/01/2020 9:29 AM    [place]

## 2020-05-01 NOTE — Progress Notes (Signed)
PFT done today. 

## 2020-05-01 NOTE — Patient Instructions (Signed)
Medication Instructions:   Your physician recommends that you continue on your current medications as directed. Please refer to the Current Medication list given to you today.  *If you need a refill on your cardiac medications before your next appointment, please call your pharmacy*   Referral :  You have been referred to TO [place]: At [facility], you and your health needs are our priority.  As part of our continuing mission to provide you with exceptional heart care, we have created designated Provider Care Teams.  These Care Teams include your primary Cardiologist (physician) and Advanced Practice Providers (APPs -  Physician Assistants and Nurse Practitioners) who all work together to provide you with the care you need, when you need it.  We recommend signing up for the patient portal called "MyChart".  Sign up information is provided on this After Visit Summary.  MyChart is used to connect with patients for Virtual Visits (Telemedicine).  Patients are able to view lab/test results, encounter notes, upcoming appointments, etc.  Non-urgent messages can be sent to your provider as well.   To learn more about what you can do with MyChart, go to [email or web address].    Your next appointment:   12 month(s)  The format for your next appointment:   In Person  Provider:   Ena Dawley, MD

## 2020-05-03 ENCOUNTER — Encounter: Payer: Self-pay | Admitting: Neurology

## 2020-08-12 ENCOUNTER — Other Ambulatory Visit: Payer: Self-pay

## 2020-08-12 ENCOUNTER — Ambulatory Visit (INDEPENDENT_AMBULATORY_CARE_PROVIDER_SITE_OTHER): Payer: 59 | Admitting: Neurology

## 2020-08-12 ENCOUNTER — Encounter: Payer: Self-pay | Admitting: Neurology

## 2020-08-12 VITALS — BP 152/90 | HR 110 | Ht 64.0 in | Wt 170.0 lb

## 2020-08-12 DIAGNOSIS — R200 Anesthesia of skin: Secondary | ICD-10-CM | POA: Diagnosis not present

## 2020-08-12 NOTE — Patient Instructions (Signed)
Nerve testing of the left leg  ELECTROMYOGRAM AND NERVE CONDUCTION STUDIES (EMG/NCS) INSTRUCTIONS  How to Prepare The neurologist conducting the EMG will need to know if you have certain medical conditions. Tell the neurologist and other EMG lab personnel if you: Have a pacemaker or any other electrical medical device Take blood-thinning medications Have hemophilia, a blood-clotting disorder that causes prolonged bleeding Bathing Take a shower or bath shortly before your exam in order to remove oils from your skin. Don't apply lotions or creams before the exam.  What to Expect You'll likely be asked to change into a hospital gown for the procedure and lie down on an examination table. The following explanations can help you understand what will happen during the exam.  Electrodes. The neurologist or a technician places surface electrodes at various locations on your skin depending on where you're experiencing symptoms. Or the neurologist may insert needle electrodes at different sites depending on your symptoms.  Sensations. The electrodes will at times transmit a tiny electrical current that you may feel as a twinge or spasm. The needle electrode may cause discomfort or pain that usually ends shortly after the needle is removed. If you are concerned about discomfort or pain, you may want to talk to the neurologist about taking a short break during the exam.  Instructions. During the needle EMG, the neurologist will assess whether there is any spontaneous electrical activity when the muscle is at rest - activity that isn't present in healthy muscle tissue - and the degree of activity when you slightly contract the muscle.  He or she will give you instructions on resting and contracting a muscle at appropriate times. Depending on what muscles and nerves the neurologist is examining, he or she may ask you to change positions during the exam.  After your EMG You may experience some temporary, minor  bruising where the needle electrode was inserted into your muscle. This bruising should fade within several days. If it persists, contact your primary care doctor.   

## 2020-08-12 NOTE — Progress Notes (Signed)
[facility] Clinic Note - Initial Visit   Date: 08/12/20  Sandra Stevenson MRN: [id number] DOB: [birth year]   Dear Dr. Meda Coffee:  Thank you for your kind referral of Sandra Stevenson for consultation of left leg numbness. Although her history is well known to you, please allow [place] to reiterate it for the purpose of our medical record. The patient was accompanied to the clinic by self.    History of Present Illness: Sandra Stevenson is a 54 y.o. right-handed female with SLE and hypertension presenting for evaluation of left leg numbness.   Starting in 2020, she began having spells of vibration/numbness involving the entire left leg.  It occurs about twice per month and lasts about 3-4 minutes. No specific triggers.  It can occur at rest or with activity.  She has occasional achy low back pain.  When it occurs, she has to stop to wake the leg back up, but she has no associated weakness in between events.   She had a left hip replacement in 2017.   She works as a Education officer, environmental.  Her mother has moved in to live with her.   Out-side paper records, electronic medical record, and images have been reviewed where available and summarized as:  Lab Results  Component Value Date   TSH 0.997 12/27/2013   Lab Results  Component Value Date   ESRSEDRATE 22 02/14/2011   POCTSEDRATE 43 (A) 04/16/2012    Past Medical History:  Diagnosis Date  . Family history of adverse reaction to anesthesia    mother's BP dropped after anesthesia, and post op N/V  . Hypertension   . Lupus ([place])   . Pericarditis 2014    Past Surgical History:  Procedure Laterality Date  . ESSURE TUBAL LIGATION    . FOOT SURGERY    . TOTAL HIP ARTHROPLASTY Left 02/04/2016   Procedure: LEFT TOTAL HIP ARTHROPLASTY ANTERIOR APPROACH;  Surgeon: Paralee Cancel, MD;  Location: [facility];  Service: Orthopedics;  Laterality: Left;     Medications:  Outpatient Encounter Medications as of  08/12/2020  Medication Sig  . azaTHIOprine (IMURAN) 50 MG tablet Take 50-100 mg by mouth 2 (two) times daily. TAKE 2 TABLETS BY MOUTH IN THE MORNING AND 1 TABLET BY MOUTH AT [facility]  . hydrochlorothiazide (HYDRODIURIL) 25 MG tablet Take 25 mg by mouth daily. For Blood pressure  . ibuprofen (ADVIL,MOTRIN) 800 MG tablet 800 mg as needed.  . Multiple Vitamin (MULTIVITAMIN WITH MINERALS) TABS tablet Take 1 tablet by mouth. 3-4 days a week  . predniSONE (DELTASONE) 5 MG tablet Take 5 mg by mouth daily with breakfast.  . Telmisartan-Amlodipine 80-5 MG TABS Take 1 tablet by mouth daily.   No facility-administered encounter medications on file as of 08/12/2020.    Allergies:  Allergies  Allergen Reactions  . Plaquenil [Hydroxychloroquine Sulfate] Hives and Itching    Family History: Family History  Problem Relation Age of Onset  . Hypertension Father   . Stroke Father   . Heart disease Father   . Hypertension Mother     Social History: Social History   Tobacco Use  . Smoking status: Never Smoker  . Smokeless tobacco: Never Used  Vaping Use  . Vaping Use: Never used  Substance Use Topics  . Alcohol use: Yes    Comment: social  . Drug use: No   Social History   Social History Narrative   Lives with son   Caffeine - 2 x weekly  Right Handed   Lives in a two story home     Vital Signs:  BP (!) 152/90   Pulse (!) 110   Ht 5\' 4"  (1.626 m)   Wt 170 lb (77.1 kg)   SpO2 99%   BMI 29.18 kg/m   Neurological Exam: MENTAL STATUS including orientation to time, place, person, recent and remote memory, attention span and concentration, language, and fund of knowledge is normal.  Speech is not dysarthric.  CRANIAL NERVES: II:  No visual field defects.     III-IV-VI: Pupils equal round and reactive.  Normal conjugate, extra-ocular eye movements in all directions of gaze.  No nystagmus.  No ptosis.   V:  Normal facial sensation.    VII:  Normal facial symmetry and movements.     VIII:  Normal hearing and vestibular function.   IX-X:  Normal palatal movement.   XI:  Normal shoulder shrug and head rotation.   XII:  Normal tongue strength and range of motion, no deviation or fasciculation.  MOTOR:  No atrophy, fasciculations or abnormal movements.  No pronator drift.   Upper Extremity:  Right  Left  Deltoid  5/5   5/5   Biceps  5/5   5/5   Triceps  5/5   5/5   Infraspinatus 5/5  5/5  Medial pectoralis 5/5  5/5  Wrist extensors  5/5   5/5   Wrist flexors  5/5   5/5   Finger extensors  5/5   5/5   Finger flexors  5/5   5/5   Dorsal interossei  5/5   5/5   Abductor pollicis  5/5   5/5   Tone (Ashworth scale)  0  0   Lower Extremity:  Right  Left  Hip flexors  5/5   5/5   Hip extensors  5/5   5/5   Adductor 5/5  5/5  Abductor 5/5  5/5  Knee flexors  5/5   5/5   Knee extensors  5/5   5/5   Dorsiflexors  5/5   5/5   Plantarflexors  5/5   5/5   Toe extensors  5/5   5/5   Toe flexors  5/5   5/5   Tone (Ashworth scale)  0  0   MSRs:  Right        Left                  brachioradialis 2+  2+  biceps 2+  2+  triceps 2+  2+  patellar 2+  2+  ankle jerk 2+  2+  Hoffman no  no  plantar response down  down   SENSORY:  Normal and symmetric perception of light touch, pinprick, vibration, and proprioception.  Romberg's sign absent.   COORDINATION/GAIT: Normal finger-to- nose-finger and heel-to-shin.  Intact rapid alternating movements bilaterally.  Able to rise from a chair without using arms.  Gait narrow based and stable. Stressed gait intact  IMPRESSION: Left leg dysesthesia involving the entire leg.  Neurological exam is normal and symptoms do not conform to a cutaneous nerve or dermatomal distribution.  Absence of UMN signs makes CNS pathology less likely.  I will proceed with NCS/EMG of the left leg to be sure.  Physical therapy was declined as symptoms are not frequent and bothersome at this time.  If EMG is normal, recommend monitoring for change or  worsening of symptoms.    Further recommendations pending results.    Thank you for allowing  me to participate in patient's care.  If I can answer any additional questions, I would be pleased to do so.    Sincerely,    [place] K. Posey Pronto, DO

## 2020-09-18 ENCOUNTER — Encounter: Payer: 59 | Admitting: Neurology

## 2020-10-12 ENCOUNTER — Ambulatory Visit: Payer: 59 | Attending: Internal Medicine

## 2020-10-12 DIAGNOSIS — Z23 Encounter for immunization: Secondary | ICD-10-CM

## 2020-10-12 NOTE — Progress Notes (Signed)
   Covid-19 Vaccination Clinic  Name:  Sandra Stevenson    MRN: [id number] DOB: January 25, 1966  10/12/2020  [name] was observed post Covid-19 immunization for 15 minutes without incident. She was provided with Vaccine Information Sheet and instruction to access the V-Safe system.   [name] was instructed to call 911 with any severe reactions post vaccine: Marland Kitchen Difficulty breathing  . Swelling of face and throat  . A fast heartbeat  . A bad rash all over body  . Dizziness and weakness   Immunizations Administered    Name Date Dose VIS Date Route   Pfizer COVID-19 Vaccine 10/12/2020 11:35 AM 0.3 mL 08/28/2020 Intramuscular   Manufacturer: [place]   Lot: [id number]   NDC: [address]-4

## 2022-10-12 ENCOUNTER — Emergency Department (HOSPITAL_COMMUNITY)
Admission: EM | Admit: 2022-10-12 | Discharge: 2022-10-12 | Disposition: A | Discharge status: Home / Self Care | Payer: BC Managed Care – PPO | Attending: Emergency Medicine | Admitting: Emergency Medicine

## 2022-10-12 ENCOUNTER — Encounter (HOSPITAL_COMMUNITY): Payer: Self-pay

## 2022-10-12 ENCOUNTER — Emergency Department (HOSPITAL_COMMUNITY): Payer: BC Managed Care – PPO

## 2022-10-12 DIAGNOSIS — S22000A Wedge compression fracture of unspecified thoracic vertebra, initial encounter for closed fracture: Secondary | ICD-10-CM

## 2022-10-12 DIAGNOSIS — S22069A Unspecified fracture of T7-T8 vertebra, initial encounter for closed fracture: Secondary | ICD-10-CM | POA: Insufficient documentation

## 2022-10-12 DIAGNOSIS — W01198A Fall on same level from slipping, tripping and stumbling with subsequent striking against other object, initial encounter: Secondary | ICD-10-CM | POA: Insufficient documentation

## 2022-10-12 DIAGNOSIS — I10 Essential (primary) hypertension: Secondary | ICD-10-CM | POA: Insufficient documentation

## 2022-10-12 DIAGNOSIS — S3992XA Unspecified injury of lower back, initial encounter: Secondary | ICD-10-CM | POA: Diagnosis present

## 2022-10-12 DIAGNOSIS — Z79899 Other long term (current) drug therapy: Secondary | ICD-10-CM | POA: Insufficient documentation

## 2022-10-12 DIAGNOSIS — Z96642 Presence of left artificial hip joint: Secondary | ICD-10-CM | POA: Insufficient documentation

## 2022-10-12 DIAGNOSIS — W19XXXA Unspecified fall, initial encounter: Secondary | ICD-10-CM

## 2022-10-12 MED ORDER — METHOCARBAMOL 500 MG PO TABS: 500.0000 mg | ORAL_TABLET | Freq: Three times a day (TID) | ORAL | 0 refills | Status: AC | PRN

## 2022-10-12 MED ORDER — HYDROCODONE-ACETAMINOPHEN 5-325 MG PO TABS
1.0000 | ORAL_TABLET | Freq: Once | ORAL | Status: AC
  Administered 2022-10-12: 1 via ORAL
  Filled 2022-10-12: qty 1

## 2022-10-12 MED ORDER — KETOROLAC TROMETHAMINE 15 MG/ML IJ SOLN
15.0000 mg | Freq: Once | INTRAMUSCULAR | Status: AC
  Administered 2022-10-12: 15 mg via INTRAMUSCULAR
  Filled 2022-10-12: qty 1

## 2022-10-12 MED ORDER — OXYCODONE-ACETAMINOPHEN 5-325 MG PO TABS: 1.0000 | ORAL_TABLET | Freq: Three times a day (TID) | ORAL | 0 refills | Status: AC | PRN

## 2022-10-12 NOTE — ED Triage Notes (Signed)
Pt arrived via EMS, fell onto back at top of staircase, and down 12 steps on her back. C/o mid-lower back pain. No LOC, no thinners.

## 2022-10-12 NOTE — ED Provider Notes (Signed)
[facility] DEPT Provider Note   CSN: [id number] Arrival date & time: 10/12/22  0801     History  Chief Complaint  Patient presents with   Sandra Stevenson is a 56 y.o. female.   Fall  Patient presents after fall.  States she was at the top of her Stevenson and her feet slipped and she landed onto her back and slid down the Stevenson like a slide on her back.  Complaining of pain in the mid to lower back.  No loss conscious.  Not hit head.  Not on blood thinners.  Pain is severe.  Had a naproxen in the waiting room and is feeling somewhat better.  History of lupus.  Previous left hip replacement.    Past Medical History:  Diagnosis Date   Family history of adverse reaction to anesthesia    mother's BP dropped after anesthesia, and post op N/V   Hypertension    Lupus ([place])    Pericarditis 2014    Home Medications Prior to Admission medications   Medication Sig Start Date End Date Taking? Authorizing Provider  methocarbamol (ROBAXIN) 500 MG tablet Take 1 tablet (500 mg total) by mouth every 8 (eight) hours as needed for muscle spasms. 10/12/22  Yes Davonna Belling, MD  oxyCODONE-acetaminophen (PERCOCET/ROXICET) 5-325 MG tablet Take 1-2 tablets by mouth every 8 (eight) hours as needed for severe pain. 10/12/22  Yes Davonna Belling, MD  azaTHIOprine (IMURAN) 50 MG tablet Take 50-100 mg by mouth 2 (two) times daily. TAKE 2 TABLETS BY MOUTH IN THE MORNING AND 1 TABLET BY MOUTH AT [facility] 12/29/15   [provider]  hydrochlorothiazide (HYDRODIURIL) 25 MG tablet Take 25 mg by mouth daily. For Blood pressure 12/04/15   [provider]  ibuprofen (ADVIL,MOTRIN) 800 MG tablet 800 mg as needed. 12/01/16   [provider]  Multiple Vitamin (MULTIVITAMIN WITH MINERALS) TABS tablet Take 1 tablet by mouth. 3-4 days a week    [provider]  predniSONE (DELTASONE) 5 MG tablet Take 5 mg by mouth daily with breakfast.     [provider]  Telmisartan-Amlodipine 80-5 MG TABS Take 1 tablet by mouth daily. 12/04/15   [provider]      Allergies    Plaquenil [hydroxychloroquine sulfate]    Review of Systems   Review of Systems  Physical Exam Updated Vital Signs BP 116/80 (BP Location: Right Arm)   Pulse 82   Temp 97.9 F (36.6 C) (Oral)   Resp 18   SpO2 100%  Physical Exam Vitals and nursing note reviewed.  HENT:     Head: Atraumatic.  Chest:     Chest wall: No tenderness.  Abdominal:     Tenderness: There is no abdominal tenderness.  Musculoskeletal:        General: Tenderness present.     Cervical back: Neck supple. No tenderness.     Comments: Tenderness over lower thoracic spine.  Also tenderness over sacral area.  Skin:    Capillary Refill: Capillary refill takes less than 2 seconds.  Neurological:     Mental Status: She is alert and oriented to person, place, and time.     ED Results / Procedures / Treatments   Labs (all labs ordered are listed, but only abnormal results are displayed) Labs Reviewed - No data to display  EKG None  Radiology CT PELVIS WO CONTRAST  Result Date: 10/12/2022 CLINICAL DATA:  Fall, pelvic fracture EXAM: CT PELVIS  WITHOUT CONTRAST TECHNIQUE: Multidetector CT imaging of the pelvis was performed following the standard protocol without intravenous contrast. RADIATION DOSE REDUCTION: This exam was performed according to the departmental dose-optimization program which includes automated exposure control, adjustment of the mA and/or kV according to patient size and/or use of iterative reconstruction technique. COMPARISON:  Left hip MRI from 12/12/2015 FINDINGS: Osseous findings and joints: Left total hip prosthesis without findings of periprosthetic fracture. Chronic right femoral head AVN, similar distribution to the 12/12/2015 exam, without overt flattening or collapse. Reactive secondary degenerative findings in the right acetabulum and  right femoral head adjacent to the region of AVN. No acute pelvic fracture. Minimal degenerative findings in the right iliac bone adjacent to the SI joint, no change from 2017. Incidental transitional lumbosacral vertebra noted. Musculotendinous: Unremarkable Other: Essure devices noted. Subtle mesenteric stranding on top most images in central mesentery as on image 1 series 3, nonspecific. Right common iliac node 0.8 cm in short axis, image 9 series 3. Right external iliac node 0.9 cm in short axis, image 23 series 3. Borderline enlarged right common iliac/pelvic sidewall node 1.0 cm in short axis, image 27 series 3. Left external iliac node 0.8 cm in short axis, image 28 series 3. Other scattered upper normal pelvic lymph nodes are noted. IMPRESSION: 1. No acute pelvic fracture. 2. Chronic right femoral head AVN, similar distribution to the 12/12/2015 exam, without overt flattening or collapse. Reactive secondary degenerative findings in the right acetabulum and right femoral head adjacent to the region of AVN. 3. Scattered upper normal pelvic lymph nodes with one lymph node borderline enlarged. These are nonspecific and could be neoplastic or reactive. 4. Subtle stranding in the central mesentery. The appearance is nonspecific but could be from low-grade inflammation such as in the setting of sclerosing mesenteritis. 5. Left total hip prosthesis without findings of periprosthetic fracture. 6. Minimal degenerative findings in the right iliac bone adjacent to the SI joint, no change from 2017. Electronically Signed   By: Van Clines M.D.   On: 10/12/2022 14:02   CT Thoracic Spine Wo Contrast  Result Date: 10/12/2022 CLINICAL DATA:  Fall with trauma to the back and subsequent pain. EXAM: CT THORACIC AND LUMBAR SPINE WITHOUT CONTRAST TECHNIQUE: Multidetector CT imaging of the thoracic and lumbar spine was performed without contrast. Multiplanar CT image reconstructions were also generated. RADIATION DOSE  REDUCTION: This exam was performed according to the departmental dose-optimization program which includes automated exposure control, adjustment of the mA and/or kV according to patient size and/or use of iterative reconstruction technique. COMPARISON:  None Available. FINDINGS: CT THORACIC SPINE FINDINGS Alignment: Normal Vertebrae: Acute superior endplate compression fracture at T8 with loss of height of less than 10%. No retropulsed bone. Possible minimal superior endplate fracture also at T9. Other levels are normal. Paraspinal and other soft tissues: Negative Disc levels: No disc level pathology seen. CT LUMBAR SPINE FINDINGS Segmentation: Transitional anatomy with L5 being sacralized. Alignment: Normal Vertebrae: No fracture in the lumbar region. Paraspinal and other soft tissues: Negative Disc levels: No disc level pathology at L3-4 or above. L4-5: Mild bulging of the disc. Facet osteoarthritis right worse than left. No apparent neural compression. The facet arthritis could be symptomatic. L5-S1: Transitional level as above. No stenosis or neural compression. Some bone/joint degenerative changes but without visible neural compression. IMPRESSION: 1. CT thoracic spine impression. Acute superior endplate compression fracture at T8 with loss of height of less than 10%. No retropulsed bone. Probable minimal superior endplate fracture at  T9. 2. CT lumbar spine impression. No acute or traumatic finding. 3. Transitional anatomy with L5 being sacralized. 4. 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 No apparent neural compression. The facet arthritis could be symptomatic. Electronically Signed   By: Nelson Chimes M.D.   On: 10/12/2022 09:06   CT Lumbar Spine Wo Contrast  Result Date: 10/12/2022 CLINICAL DATA:  Fall with trauma to the back and subsequent pain. EXAM: CT THORACIC AND LUMBAR SPINE WITHOUT CONTRAST TECHNIQUE: Multidetector CT imaging of the thoracic and lumbar spine was performed without  contrast. Multiplanar CT image reconstructions were also generated. RADIATION DOSE REDUCTION: This exam was performed according to the departmental dose-optimization program which includes automated exposure control, adjustment of the mA and/or kV according to patient size and/or use of iterative reconstruction technique. COMPARISON:  None Available. FINDINGS: CT THORACIC SPINE FINDINGS Alignment: Normal Vertebrae: Acute superior endplate compression fracture at T8 with loss of height of less than 10%. No retropulsed bone. Possible minimal superior endplate fracture also at T9. Other levels are normal. Paraspinal and other soft tissues: Negative Disc levels: No disc level pathology seen. CT LUMBAR SPINE FINDINGS Segmentation: Transitional anatomy with L5 being sacralized. Alignment: Normal Vertebrae: No fracture in the lumbar region. Paraspinal and other soft tissues: Negative Disc levels: No disc level pathology at L3-4 or above. L4-5: Mild bulging of the disc. Facet osteoarthritis right worse than left. No apparent neural compression. The facet arthritis could be symptomatic. L5-S1: Transitional level as above. No stenosis or neural compression. Some bone/joint degenerative changes but without visible neural compression. IMPRESSION: 1. CT thoracic spine impression. Acute superior endplate compression fracture at T8 with loss of height of less than 10%. No retropulsed bone. Probable minimal superior endplate fracture at T9. 2. CT lumbar spine impression. No acute or traumatic finding. 3. Transitional anatomy with L5 being sacralized. 4. 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 No apparent neural compression. The facet arthritis could be symptomatic. Electronically Signed   By: Nelson Chimes M.D.   On: 10/12/2022 09:06    Procedures Procedures    Medications Ordered in ED Medications  HYDROcodone-acetaminophen (NORCO/VICODIN) 5-325 MG per tablet 1 tablet (1 tablet Oral Given 10/12/22 1400)  ketorolac  (TORADOL) 15 MG/ML injection 15 mg (15 mg Intramuscular Given 10/12/22 1400)    ED Course/ Medical Decision Making/ A&P                           Medical Decision Making Amount and/or Complexity of Data Reviewed Radiology: ordered.  Risk Prescription drug management.   Patient with mechanical fall.  Slipped on Stevenson.  Mid back pain.  CT scan done and showed T8 and T9 compression fractures.  No retropulsion seen.  Not on blood thinners.  Feeling somewhat better after treatment.  However on examination had also some sacral pain.  CT scan done and reassuring.  No fracture in this area.  Did have some likely chronic findings that are probably unrelated.  No abdominal pain although states she does get gassy at times.  Will treat symptomatically with pain relievers and muscle lectures.  Work note provided.  Outpatient follow-up with neurosurgery as needed.  Will discharge home.        Final Clinical Impression(s) / ED Diagnoses Final diagnoses:  Fall, initial encounter  Compression fracture of thoracic vertebra, unspecified thoracic vertebral level, initial encounter [facility])    Rx / DC Orders ED Discharge Orders  Ordered    methocarbamol (ROBAXIN) 500 MG tablet  Every 8 hours PRN        10/12/22 1434    oxyCODONE-acetaminophen (PERCOCET/ROXICET) 5-325 MG tablet  Every 8 hours PRN        10/12/22 1434              Davonna Belling, MD 10/12/22 1439

## 2022-10-12 NOTE — ED Provider Triage Note (Addendum)
Emergency Medicine Provider Triage Evaluation Note  Sandra Stevenson , a 56 y.o. female  was evaluated in triage.  Pt complains of fall.  Occurred this morning around 7 AM.  Patient was walking downstairs and tripped by her dog.  Landed on her mid back and slid down 8-10 stairs.  Denies saddle anesthesia, urinary or bowel incontinence.  Endorses midline tenderness of the thoracic and lumbar spine.  States she can still move her extremities but is difficult to sit up straight. Denies head injury and LOC.  Review of Systems  Positive: See above Negative: See above  Physical Exam  BP 116/80 (BP Location: Right Arm)   Pulse 82   Temp 97.9 F (36.6 C) (Oral)   Resp 18   SpO2 100%  Gen:   Awake, no distress   Resp:  Normal effort  MSK:   Moves extremities without difficulty  Other:  Midline thoracic and lumbar tenderness  Medical Decision Making  Medically screening exam initiated at 8:27 AM.  Appropriate orders placed.  Jaquita Rector was informed that the remainder of the evaluation will be completed by another provider, this initial triage assessment does not replace that evaluation, and the importance of remaining in the ED until their evaluation is complete.  Work up started   Harriet Pho, PA-C 10/12/22 0828    Harriet Pho, PA-C 10/12/22 [phone]

## 2023-01-15 ENCOUNTER — Other Ambulatory Visit: Payer: Self-pay | Admitting: Obstetrics

## 2023-01-15 DIAGNOSIS — M4850XA Collapsed vertebra, not elsewhere classified, site unspecified, initial encounter for fracture: Secondary | ICD-10-CM

## 2023-07-16 ENCOUNTER — Inpatient Hospital Stay: Admission: RE | Admit: 2023-07-16 | Payer: 59 | Source: Ambulatory Visit

## 2023-07-27 ENCOUNTER — Encounter (HOSPITAL_COMMUNITY): Payer: Self-pay | Admitting: Emergency Medicine

## 2023-07-27 ENCOUNTER — Emergency Department (HOSPITAL_COMMUNITY)
Admission: EM | Admit: 2023-07-27 | Discharge: 2023-07-27 | Disposition: A | Discharge status: Home / Self Care | Payer: BC Managed Care – PPO | Attending: Emergency Medicine | Admitting: Emergency Medicine

## 2023-07-27 ENCOUNTER — Other Ambulatory Visit: Payer: Self-pay

## 2023-07-27 ENCOUNTER — Emergency Department (HOSPITAL_COMMUNITY): Payer: BC Managed Care – PPO

## 2023-07-27 DIAGNOSIS — R7989 Other specified abnormal findings of blood chemistry: Secondary | ICD-10-CM | POA: Diagnosis present

## 2023-07-27 DIAGNOSIS — R509 Fever, unspecified: Secondary | ICD-10-CM | POA: Insufficient documentation

## 2023-07-27 DIAGNOSIS — R600 Localized edema: Secondary | ICD-10-CM | POA: Insufficient documentation

## 2023-07-27 DIAGNOSIS — M791 Myalgia, unspecified site: Secondary | ICD-10-CM | POA: Insufficient documentation

## 2023-07-27 DIAGNOSIS — R531 Weakness: Secondary | ICD-10-CM | POA: Diagnosis not present

## 2023-07-27 DIAGNOSIS — R6889 Other general symptoms and signs: Secondary | ICD-10-CM

## 2023-07-27 MED ORDER — IOHEXOL 350 MG/ML SOLN
80.0000 mL | Freq: Once | INTRAVENOUS | Status: AC | PRN
  Administered 2023-07-27: 80 mL via INTRAVENOUS

## 2023-07-27 NOTE — ED Triage Notes (Signed)
Pt states that she was seen at [facility] and was told her d-dimer was 3000 and sent to come to the ER. Pt c/o fever and fatigue, negative covid and flu tests at [facility]. Was prescribed azithromycin.

## 2023-07-27 NOTE — Discharge Instructions (Signed)
The workup in the emergency room including CAT scan to look for blood clots is negative. We have ordered ultrasound DVT.  If the ultrasound DVT is negative, then we would presume that your elevated D-dimer, for which you were sent to the emergency room is because of your lupus.  We recommend that you follow-up with your primary care doctor as soon as possible.

## 2023-07-27 NOTE — ED Provider Notes (Signed)
[place] EMERGENCY DEPARTMENT AT [facility] Provider Note   CSN: [id number] Arrival date & time: 07/27/23  1824     History  Chief Complaint  Patient presents with   Abnormal Labs    Sandra Stevenson is a 57 y.o. female.  HPI    Pt comes in with 3 days of body aches, fevers, weakness and elevated D-dimer on her blood test.  Patient indicates that she started having fevers, weakness, flulike symptoms and suspected that she had COVID.  She went to urgent care, where they did a COVID test and was negative.  Given that she had mentioned exertional shortness of breath, they also ordered a D-dimer.  The D-dimer was over 3000 and she was sent to the ER.  Patient has history of lupus for which she is on Imuran.  She denies any chest pain.  She indicates that the shortness of breath is usually exertional.  Pt has no hx of PE, DVT and denies any exogenous hormone (testosterone / estrogen) use, long distance travels or surgery in the past 6 weeks, active cancer, recent immobilization.   Home Medications Prior to Admission medications   Medication Sig Start Date End Date Taking? Authorizing Provider  azaTHIOprine (IMURAN) 50 MG tablet Take 50-100 mg by mouth 2 (two) times daily. TAKE 2 TABLETS BY MOUTH IN THE MORNING AND 1 TABLET BY MOUTH AT [facility] 12/29/15   [provider]  hydrochlorothiazide (HYDRODIURIL) 25 MG tablet Take 25 mg by mouth daily. For Blood pressure 12/04/15   [provider]  ibuprofen (ADVIL,MOTRIN) 800 MG tablet 800 mg as needed. 12/01/16   [provider]  methocarbamol (ROBAXIN) 500 MG tablet Take 1 tablet (500 mg total) by mouth every 8 (eight) hours as needed for muscle spasms. 10/12/22   Benjiman Core, MD  Multiple Vitamin (MULTIVITAMIN WITH MINERALS) TABS tablet Take 1 tablet by mouth. 3-4 days a week    [provider]  oxyCODONE-acetaminophen (PERCOCET/ROXICET) 5-325 MG tablet Take 1-2 tablets by mouth every 8  (eight) hours as needed for severe pain. 10/12/22   Benjiman Core, MD  predniSONE (DELTASONE) 5 MG tablet Take 5 mg by mouth daily with breakfast.    [provider]  Telmisartan-Amlodipine 80-5 MG TABS Take 1 tablet by mouth daily. 12/04/15   [provider]      Allergies    Plaquenil [hydroxychloroquine sulfate]    Review of Systems   Review of Systems  All other systems reviewed and are negative.   Physical Exam Updated Vital Signs BP 120/82 (BP Location: Left Arm)   Pulse (!) 101   Temp 98.6 F (37 C) (Oral)   Resp 16   Ht 5\' 4"  (1.626 m)   Wt 72.6 kg   LMP 01/14/2016 (Approximate) Comment: negative HCG 01-31-16  SpO2 99%   BMI 27.46 kg/m  Physical Exam Vitals and nursing note reviewed.  Constitutional:      Appearance: She is well-developed.  HENT:     Head: Atraumatic.  Eyes:     Extraocular Movements: Extraocular movements intact.     Pupils: Pupils are equal, round, and reactive to light.  Cardiovascular:     Rate and Rhythm: Normal rate.  Pulmonary:     Effort: Pulmonary effort is normal.  Musculoskeletal:        General: Swelling present.     Cervical back: Normal range of motion and neck supple.     Right lower leg: Edema present.  Left lower leg: Edema present.  Skin:    General: Skin is warm and dry.  Neurological:     Mental Status: She is alert and oriented to person, place, and time.     ED Results / Procedures / Treatments   Labs (all labs ordered are listed, but only abnormal results are displayed) Labs Reviewed - No data to display  EKG EKG Interpretation Date/Time:  Tuesday July 27 2023 19:00:31 EDT Ventricular Rate:  99 PR Interval:  144 QRS Duration:  70 QT Interval:  405 QTC Calculation: 520 R Axis:   65  Text Interpretation: Sinus rhythm Borderline T abnormalities, diffuse leads Prolonged QT interval No acute changes Confirmed by Derwood Kaplan ([id number]) on 07/27/2023 11:02:07 PM  Radiology CT Angio  Chest PE W and/or Wo Contrast  Result Date: 07/27/2023 CLINICAL DATA:  Elevated D-dimer.  Fever and fatigue. EXAM: CT ANGIOGRAPHY CHEST WITH CONTRAST TECHNIQUE: Multidetector CT imaging of the chest was performed using the standard protocol during bolus administration of intravenous contrast. Multiplanar CT image reconstructions and MIPs were obtained to evaluate the vascular anatomy. RADIATION DOSE REDUCTION: This exam was performed according to the departmental dose-optimization program which includes automated exposure control, adjustment of the mA and/or kV according to patient size and/or use of iterative reconstruction technique. CONTRAST:  80mL OMNIPAQUE IOHEXOL 350 MG/ML SOLN COMPARISON:  CT heart morphology 05/11/2018 and CT thoracic spine 10/12/2022 FINDINGS: Cardiovascular: Negative for acute pulmonary embolism. Normal caliber thoracic aorta. No pericardial effusion. Mediastinum/Nodes: Trachea and esophagus are unremarkable. No thoracic adenopathy Lungs/Pleura: No focal consolidation, pleural effusion, or pneumothorax. Upper Abdomen: No acute abnormality. Musculoskeletal: No acute fracture. Mild chronic superior endplate fractures of T8 and T9. Review of the MIP images confirms the above findings. IMPRESSION: Negative for acute pulmonary embolism. No acute abnormality in the chest. Electronically Signed   By: Minerva Fester M.D.   On: 07/27/2023 21:35    Procedures Procedures    Medications Ordered in ED Medications  iohexol (OMNIPAQUE) 350 MG/ML injection 80 mL (80 mLs Intravenous Contrast Given 07/27/23 2058)    ED Course/ Medical Decision Making/ A&P                                 Medical Decision Making Amount and/or Complexity of Data Reviewed Radiology: ordered.  Risk Prescription drug management.   57 year old patient comes in with chief complaint of abnormal labs. Patient has history of lupus. She has no history of DVT, PE. She had gone to urgent care with flulike  symptoms, but was found to have elevated dimer, negative COVID test and sent to the ER.  EKG shows tachycardia, heart rate in the 90s.  Lower extremity exam reveals pitting edema bilaterally, no signs of DVT.  Differential diagnosis for the elevated D-dimer includes dimer that is elevated because of autoimmune condition like lupus -which is a prothrombotic condition.  Other possibility does include PE.  Clinically she does not have pericarditis as there is no chest pain and EKG is also reassuring.   Given the elevated dimer, we will proceed with CT PE and ultrasound DVT.  Reassessment: CT PE is negative for acute blood clot.  CT was independently interpreted for blood clot assessment, there is no saddle PE.  The patient appears reasonably screened and/or stabilized for discharge and I doubt any other medical condition or other [facility] requiring further screening, evaluation, or treatment in the ED at this time prior to  discharge.   Results from the ER workup discussed with the patient face to face and all questions answered to the best of my ability. The patient is safe for discharge with strict return precautions.   Final Clinical Impression(s) / ED Diagnoses Final diagnoses:  Positive D dimer  Flu-like symptoms    Rx / DC Orders ED Discharge Orders          Ordered    LE Venous       Comments: IMPORTANT PATIENT INSTRUCTIONS:  You have been scheduled for an Outpatient Vascular Study at [facility].    If tomorrow is a Saturday, Sunday or holiday, please go to the [facility] Emergency Department Registration Desk at 11 am tomorrow morning and tell them you are there for a vascular study.   If tomorrow is a weekday (Monday-Friday), please go to [facility] Registration at 11 am and tell them you are there for a vascular study.   07/27/23 2251              Derwood Kaplan, MD 07/27/23 2304

## 2023-07-28 ENCOUNTER — Ambulatory Visit (HOSPITAL_COMMUNITY)
Admission: RE | Admit: 2023-07-28 | Discharge: 2023-07-28 | Disposition: A | Discharge status: Home / Self Care | Payer: BC Managed Care – PPO | Source: Ambulatory Visit | Attending: Emergency Medicine | Admitting: Emergency Medicine

## 2023-07-28 DIAGNOSIS — R7989 Other specified abnormal findings of blood chemistry: Secondary | ICD-10-CM | POA: Insufficient documentation

## 2023-11-11 ENCOUNTER — Telehealth: Payer: Self-pay | Admitting: *Deleted

## 2023-11-11 NOTE — Progress Notes (Signed)
 Transition Care Management Unsuccessful Follow-up Telephone Call  Date of discharge and from where:  [facility] 10/27/2023  Attempts:  1st Attempt  Reason for unsuccessful TCM follow-up call:  Left voice message

## 2023-12-06 ENCOUNTER — Other Ambulatory Visit: Payer: Self-pay | Admitting: Obstetrics

## 2023-12-06 DIAGNOSIS — Z1231 Encounter for screening mammogram for malignant neoplasm of breast: Secondary | ICD-10-CM

## 2025-01-02 ENCOUNTER — Ambulatory Visit: Admitting: Student in an Organized Health Care Education/Training Program
